# Patient Record
Sex: Female | Born: 1975 | Race: White | Hispanic: No | Marital: Married | Smoking: Never smoker
Health system: Southern US, Community
[De-identification: ages and names within clinical notes are randomized; demographics above are authoritative.]

## PROBLEM LIST (undated history)

## (undated) DIAGNOSIS — I10 Essential (primary) hypertension: Secondary | ICD-10-CM

## (undated) DIAGNOSIS — F419 Anxiety disorder, unspecified: Secondary | ICD-10-CM

## (undated) DIAGNOSIS — R112 Nausea with vomiting, unspecified: Secondary | ICD-10-CM

## (undated) DIAGNOSIS — F32A Depression, unspecified: Secondary | ICD-10-CM

## (undated) DIAGNOSIS — F329 Major depressive disorder, single episode, unspecified: Secondary | ICD-10-CM

## (undated) DIAGNOSIS — I639 Cerebral infarction, unspecified: Secondary | ICD-10-CM

## (undated) DIAGNOSIS — J45909 Unspecified asthma, uncomplicated: Secondary | ICD-10-CM

## (undated) DIAGNOSIS — Z9889 Other specified postprocedural states: Secondary | ICD-10-CM

## (undated) HISTORY — DX: Unspecified asthma, uncomplicated: J45.909

## (undated) HISTORY — DX: Depression, unspecified: F32.A

## (undated) HISTORY — DX: Anxiety disorder, unspecified: F41.9

## (undated) HISTORY — PX: MASTECTOMY: SHX3

## (undated) HISTORY — PX: BREAST BIOPSY: SHX20

## (undated) HISTORY — DX: Major depressive disorder, single episode, unspecified: F32.9

## (undated) HISTORY — DX: Cerebral infarction, unspecified: I63.9

---

## 1998-06-06 ENCOUNTER — Emergency Department (HOSPITAL_COMMUNITY): Admission: EM | Admit: 1998-06-06 | Discharge: 1998-06-06 | Payer: Self-pay | Admitting: Emergency Medicine

## 1999-07-07 ENCOUNTER — Inpatient Hospital Stay (HOSPITAL_COMMUNITY): Admission: EM | Admit: 1999-07-07 | Discharge: 1999-07-11 | Payer: Self-pay | Admitting: Emergency Medicine

## 1999-07-07 ENCOUNTER — Encounter: Payer: Self-pay | Admitting: Emergency Medicine

## 1999-07-08 ENCOUNTER — Encounter: Payer: Self-pay | Admitting: Neurology

## 2000-10-24 HISTORY — PX: TUBAL LIGATION: SHX77

## 2001-06-12 ENCOUNTER — Other Ambulatory Visit: Admission: RE | Admit: 2001-06-12 | Discharge: 2001-06-12 | Payer: Self-pay | Admitting: Obstetrics and Gynecology

## 2001-07-05 ENCOUNTER — Ambulatory Visit (HOSPITAL_COMMUNITY): Admission: RE | Admit: 2001-07-05 | Discharge: 2001-07-05 | Payer: Self-pay | Admitting: Obstetrics and Gynecology

## 2001-09-05 ENCOUNTER — Encounter: Payer: Self-pay | Admitting: Family Medicine

## 2001-09-05 ENCOUNTER — Encounter: Admission: RE | Admit: 2001-09-05 | Discharge: 2001-09-05 | Payer: Self-pay | Admitting: Family Medicine

## 2001-10-24 DIAGNOSIS — I639 Cerebral infarction, unspecified: Secondary | ICD-10-CM

## 2001-10-24 HISTORY — DX: Cerebral infarction, unspecified: I63.9

## 2002-10-09 ENCOUNTER — Other Ambulatory Visit: Admission: RE | Admit: 2002-10-09 | Discharge: 2002-10-09 | Payer: Self-pay | Admitting: Obstetrics and Gynecology

## 2003-05-01 ENCOUNTER — Other Ambulatory Visit: Admission: RE | Admit: 2003-05-01 | Discharge: 2003-05-01 | Payer: Self-pay | Admitting: Obstetrics and Gynecology

## 2003-07-10 ENCOUNTER — Encounter: Payer: Self-pay | Admitting: Family Medicine

## 2003-07-10 ENCOUNTER — Encounter: Admission: RE | Admit: 2003-07-10 | Discharge: 2003-07-10 | Payer: Self-pay | Admitting: Family Medicine

## 2008-06-19 ENCOUNTER — Ambulatory Visit: Payer: Self-pay | Admitting: Family Medicine

## 2008-06-19 DIAGNOSIS — J309 Allergic rhinitis, unspecified: Secondary | ICD-10-CM | POA: Insufficient documentation

## 2008-06-19 DIAGNOSIS — R519 Headache, unspecified: Secondary | ICD-10-CM | POA: Insufficient documentation

## 2008-06-19 DIAGNOSIS — R51 Headache: Secondary | ICD-10-CM | POA: Insufficient documentation

## 2008-06-19 DIAGNOSIS — Z87442 Personal history of urinary calculi: Secondary | ICD-10-CM | POA: Insufficient documentation

## 2008-06-19 DIAGNOSIS — J45990 Exercise induced bronchospasm: Secondary | ICD-10-CM | POA: Insufficient documentation

## 2008-07-02 ENCOUNTER — Ambulatory Visit: Payer: Self-pay | Admitting: Family Medicine

## 2008-07-04 ENCOUNTER — Ambulatory Visit: Payer: Self-pay | Admitting: Internal Medicine

## 2008-07-08 ENCOUNTER — Encounter (INDEPENDENT_AMBULATORY_CARE_PROVIDER_SITE_OTHER): Payer: Self-pay | Admitting: Internal Medicine

## 2008-07-08 DIAGNOSIS — K8020 Calculus of gallbladder without cholecystitis without obstruction: Secondary | ICD-10-CM | POA: Insufficient documentation

## 2008-07-22 ENCOUNTER — Encounter (INDEPENDENT_AMBULATORY_CARE_PROVIDER_SITE_OTHER): Payer: Self-pay | Admitting: Internal Medicine

## 2008-08-04 ENCOUNTER — Ambulatory Visit: Payer: Self-pay | Admitting: Family Medicine

## 2008-08-06 ENCOUNTER — Encounter: Payer: Self-pay | Admitting: Family Medicine

## 2008-08-06 ENCOUNTER — Ambulatory Visit: Payer: Self-pay | Admitting: Family Medicine

## 2008-08-06 ENCOUNTER — Other Ambulatory Visit: Admission: RE | Admit: 2008-08-06 | Discharge: 2008-08-06 | Payer: Self-pay | Admitting: Family Medicine

## 2008-08-06 LAB — CONVERTED CEMR LAB
ALT: 36 units/L — ABNORMAL HIGH (ref 0–35)
AST: 22 units/L (ref 0–37)
Albumin: 3.5 g/dL (ref 3.5–5.2)
Alkaline Phosphatase: 45 units/L (ref 39–117)
BUN: 11 mg/dL (ref 6–23)
Bilirubin, Direct: 0.1 mg/dL (ref 0.0–0.3)
CO2: 29 meq/L (ref 19–32)
Calcium: 8.9 mg/dL (ref 8.4–10.5)
Chloride: 106 meq/L (ref 96–112)
Cholesterol: 197 mg/dL (ref 0–200)
Creatinine, Ser: 0.6 mg/dL (ref 0.4–1.2)
GFR calc Af Amer: 147 mL/min
GFR calc non Af Amer: 122 mL/min
Glucose, Bld: 84 mg/dL (ref 70–99)
HDL: 39.7 mg/dL (ref >39.0)
LDL Cholesterol: 141 mg/dL — ABNORMAL HIGH (ref 0–99)
Pap Smear: NORMAL
Potassium: 3.9 meq/L (ref 3.5–5.1)
Sodium: 142 meq/L (ref 135–145)
Total Bilirubin: 0.5 mg/dL (ref 0.3–1.2)
Total CHOL/HDL Ratio: 5
Total Protein: 6.7 g/dL (ref 6.0–8.3)
Triglycerides: 82 mg/dL (ref 0–149)
VLDL: 16 mg/dL (ref 0–40)

## 2008-08-15 ENCOUNTER — Encounter (INDEPENDENT_AMBULATORY_CARE_PROVIDER_SITE_OTHER): Payer: Self-pay | Admitting: *Deleted

## 2008-08-19 ENCOUNTER — Encounter: Payer: Self-pay | Admitting: Family Medicine

## 2010-07-24 HISTORY — PX: GALLBLADDER SURGERY: SHX652

## 2010-11-21 LAB — CONVERTED CEMR LAB
Amylase: 41 units/L (ref 27–131)
BUN: 16 mg/dL (ref 6–23)
Basophils Absolute: 0.1 10*3/uL (ref 0.0–0.1)
Basophils Relative: 1.5 % (ref 0.0–3.0)
CO2: 28 meq/L (ref 19–32)
Calcium: 8.7 mg/dL (ref 8.4–10.5)
Chloride: 108 meq/L (ref 96–112)
Creatinine, Ser: 0.6 mg/dL (ref 0.4–1.2)
Eosinophils Absolute: 0.3 10*3/uL (ref 0.0–0.7)
Eosinophils Relative: 3 % (ref 0.0–5.0)
GFR calc Af Amer: 148 mL/min
GFR calc non Af Amer: 122 mL/min
Glucose, Bld: 83 mg/dL (ref 70–99)
HCT: 40 % (ref 36.0–46.0)
Hemoglobin: 13.6 g/dL (ref 12.0–15.0)
Lipase: 24 units/L (ref 11.0–59.0)
Lymphocytes Relative: 28.1 % (ref 12.0–46.0)
MCHC: 34 g/dL (ref 30.0–36.0)
MCV: 91.1 fL (ref 78.0–100.0)
Monocytes Absolute: 0.7 10*3/uL (ref 0.1–1.0)
Monocytes Relative: 7.4 % (ref 3.0–12.0)
Neutro Abs: 5.7 10*3/uL (ref 1.4–7.7)
Neutrophils Relative %: 60 % (ref 43.0–77.0)
Pap Smear: NORMAL
Platelets: 227 10*3/uL (ref 150–400)
Potassium: 4.1 meq/L (ref 3.5–5.1)
RBC: 4.39 M/uL (ref 3.87–5.11)
RDW: 12.4 % (ref 11.5–14.6)
Sodium: 139 meq/L (ref 135–145)
WBC: 9.5 10*3/uL (ref 4.5–10.5)

## 2010-11-23 NOTE — Consult Note (Signed)
Summary: [facility]/Dr. [facility]/Dr. Freida Busman   Imported By: Eleonore Chiquito 08/26/2008 15:02:28  _____________________________________________________________________  External Attachment:    Type:   Image     Comment:   External Document

## 2010-11-23 NOTE — Assessment & Plan Note (Signed)
Summary: NEW PATIENT/[facility]   Vital Signs:  Patient Profile:   35 Years Old Female Height:     64 inches Weight:      158 pounds Temp:     97.8 degrees F oral Pulse rate:   80 / minute Pulse rhythm:   regular BP sitting:   102 / 70  (left arm) Cuff size:   regular  Vitals Entered ByMarland Kitchen Delilah Shan (June 19, 2008 10:39 AM)                 Chief Complaint:  New Patient to Establish.  History of Present Illness: Never had chol or DM screen.  Occ chest pain, fatigue associated with it. No SOB, but pleuritic pain occ with it feels like heart beating really fast or really slow mild nausea with it no real panic with it Chest pain last few hours to a few days occuring for several years, about once a month no associated with eating, occurs at rest, occ has exertional compnent where she has exercised and pain started after and was worsened  She is very anxious, stressed and wonders if this is associated  History of TIA 2001: no clear cause of clot, not on birth control, but was on ephedrine,    Current Allergies (reviewed today): No known allergies   Past Medical History:    Allergic rhinitis  Past Surgical History:    C-section 1995, BTL   Family History:    father: CVA hemmorhagic, HTN, high cholesterol    mother: HTN, high cholesterol, DM, breast cancer age 58, morbid obesity    siblings: healthy    MGF: DVT, varicosities    MGM: Alzheimer's  Social History:    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      Denies fever, loss of appetite, malaise, sleep disorder, sweats, and weakness.  CV  Complains of chest pain or discomfort and palpitations.      Denies lightheadness, near fainting, shortness of breath with exertion, swelling of feet, and swelling of hands.  Resp      Denies sputum productive.  GI      chroninc  abdominal pain diffuse  GU      Denies abnormal vaginal bleeding and dysuria.  MS      Denies joint pain, joint redness, and joint swelling.  Derm      Denies lesion(s).  Psych      Complains of anxiety.      Denies depression, easily angered, easily tearful, irritability, and suicidal thoughts/plans.   Physical Exam  General:     Well-developed,well-nourished,in no acute distress; alert,appropriate and cooperative throughout examination Head:     Normocephalic and atraumatic without obvious abnormalities. No apparent alopecia or balding. Eyes:     No corneal or conjunctival inflammation noted. EOMI. Perrla. Funduscopic exam benign, without hemorrhages, exudates or papilledema. Vision grossly normal. Ears:     External ear exam shows no significant lesions or deformities.  Otoscopic examination reveals  clear canals, tympanic membranes are intact bilaterally without bulging, retraction, inflammation or discharge. Hearing is grossly normal bilaterally. Nose:     External nasal examination shows no deformity or inflammation. Nasal mucosa are pink and moist without lesions or exudates. Mouth:     Oral mucosa and oropharynx without lesions or exudates.  Teeth in good repair. Neck:     no carotid bruit or thyromegaly  Chest Wall:     No deformities, masses, or tenderness noted. Points to left CC junction where pain has been in past Lungs:     Normal respiratory effort, chest expands symmetrically. Lungs are clear to auscultation, no crackles or wheezes. Heart:     Normal rate and regular rhythm. S1 and S2 normal without gallop, murmur, click, rub or other extra sounds. Abdomen:     Bowel sounds positive,abdomen soft and non-tender without masses,  organomegaly or hernias noted. Pulses:     R and L posterior tibial pulses are full and equal bilaterally  Extremities:     no edema    Impression & Recommendations:  Problem # 1:  CHEST PAIN, ATYPICAL (ICD-786.59) EKG nml. Given description of pain it is most likely intermittant costochondirits. Recommend chest wall stretches, Ibuprofen 800 mg q8 hours as needed pain.Will check DM and chol screen. 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 Will get records to review to determine if further testing needed.   Complete Medication List: 1)  [place] Hoodia 250 Mg Caps (Hoodia gordonii) .... Sometimes   Patient Instructions: 1)  Fasting LIPIDs, CMET Dx v77.91 2)  CPE with pap few days after labs   ] Current Allergies (reviewed today): No known allergies  Current Medications (including changes made in today's visit):  S[place] HOODIA 250 MG CAPS (HOODIA GORDONII) sometimes   Tetanus/Td Immunization History:    Tetanus/Td # 1:  Historical (11/24/2005)

## 2010-11-23 NOTE — Letter (Signed)
Summary: Results Follow up Letter  [place] [facility]  [address] [place], [place] [address]   Phone: [phone]  Fax: [phone]    08/15/2008 MRN: [id number]    [address] [place] [address] [place], [place]  [address]    Dear Wendy Simpson,  The following are the results of your recent test(s):  Test         Result    Pap Smear:        Normal __X___  Not Normal _____ Comments:  Routine yearly follow up is recommended.  You will be due again: 07/2009 ______________________________________________________ Cholesterol: LDL(Bad cholesterol):         Your goal is less than:         HDL (Good cholesterol):       Your goal is more than: Comments:  ______________________________________________________ Mammogram:        Normal _____  Not Normal _____ Comments:  ___________________________________________________________________ Hemoccult:        Normal _____  Not normal _______ Comments:    _____________________________________________________________________ Other Tests:    We routinely do not discuss normal results over the telephone.  If you desire a copy of the results, or you have any questions about this information we can discuss them at your next office visit.   Sincerely,    Excell Seltzer, M.D.  AEB:lsf

## 2010-11-23 NOTE — Consult Note (Signed)
Summary: [facility]/Consultation Report/Dr. [facility] Surgery/Consultation Report/Dr. Freida Busman   Imported By: Mickle Asper 07/31/2008 16:46:06  _____________________________________________________________________  External Attachment:    Type:   Image     Comment:   External Document

## 2010-11-23 NOTE — Miscellaneous (Signed)
Summary: Orders Update  Clinical Lists Changes  Problems: Added new problem of CHOLELITHIASIS (ICD-574.20) - Signed Orders: Added new Referral order of Surgical Referral (Surgery) - Signed

## 2010-11-23 NOTE — Assessment & Plan Note (Signed)
Summary: cpx with pap/rbh   Vital Signs:  Patient Profile:   35 Years Old Female Height:     64 inches Weight:      161.13 pounds BMI:     27.76 Temp:     98 degrees F oral Pulse rate:   84 / minute Pulse rhythm:   regular BP sitting:   132 / 92  (left arm) Cuff size:   regular  Vitals Entered By: Delilah Shan (August 06, 2008 2:35 PM)                 Chief Complaint:  CPX with Pap(?)  still has menstrual period.  History of Present Illness: Ding well. No acute concerns, recent gallbladder surgery, healing well.   Acute Visit History: Pertinent previous surgeries include cholecystectomy.           Current Allergies (reviewed today): ! 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       Married       2 children       Never Smoked       Alcohol use-yes, rare       Drug use-no       Regular exercise-no       Diet: fruits and veggies, water    Review of Systems  General      Complains of fatigue.      Denies chills and fever.  CV      Denies chest pain or discomfort.  Resp      Denies shortness of breath.  GI      Denies abdominal pain, bloody stools, constipation, and diarrhea.  GU      Denies abnormal vaginal bleeding and dysuria.      no vaginal discharge, no concern about STDs  Derm      Denies rash.      no breast lesions   Physical Exam  General:     Well-developed,well-nourished,in no acute distress; alert,appropriate and cooperative throughout examination Eyes:     No corneal or conjunctival inflammation noted. EOMI. Perrla. Funduscopic exam benign, without hemorrhages, exudates or papilledema. Vision grossly normal. Ears:  External ear exam shows no significant lesions or deformities.  Otoscopic examination reveals clear canals, tympanic membranes are intact bilaterally without bulging, retraction, inflammation or discharge. Hearing is grossly normal bilaterally. Nose:     External nasal examination shows no deformity or inflammation. Nasal mucosa are pink and moist without lesions or exudates. Mouth:     Oral mucosa and oropharynx without lesions or exudates.  Teeth in good repair. Neck:     no carotid bruit or thyromegaly  Breasts:     No mass, nodules, thickening, tenderness, bulging, retraction, inflamation, nipple discharge or skin changes noted.   Lungs:     Normal respiratory effort, chest expands symmetrically. Lungs are clear to auscultation, no crackles or wheezes. Heart:     Normal rate and regular rhythm. S1 and S2 normal without gallop, murmur, click, rub or other extra sounds. Abdomen:     Bowel sounds positive,abdomen soft and non-tender without masses, organomegaly or hernias noted. Well healing surgical sites Genitalia:  Pelvic Exam:        External: normal female genitalia without lesions or masses        Vagina: normal without lesions or masses        Cervix: normal without lesions or masses        Adnexa: normal bimanual exam without masses or fullness        Uterus: normal by palpation        Pap smear: performed Msk:     No deformity or scoliosis noted of thoracic or lumbar spine.   Pulses:     R and L posterior tibial pulses are full and equal bilaterally  Neurologic:     No cranial nerve deficits noted. Station and gait are normal. DTRs are symmetrical throughout. Sensory, motor and coordinative functions appear intact. Skin:     Intact without suspicious lesions or rashes Cervical Nodes:     no cervical or supraclavicular lymphadenopathy  Psych:     Cognition and judgment appear intact. Alert and cooperative with normal attention span and concentration. No apparent  delusions, illusions, hallucinations    Impression & Recommendations:  Problem # 1:  Preventive Health Care (ICD-V70.0) Reviewed preventive care protocols, scheduled due services, and updated immunizations. Encouraged exercise, weight loss, healthy eating habits.   Problem # 2:  Gynecological examination-routine (ICD-V72.31) PAP pending.     ] Prior Medications (reviewed today): None Current Allergies (reviewed today): ! CODEINE Current Medications (including changes made in today's visit):  None    Flu Vaccine Result Date:  08/06/2008 Flu Vaccine Result:  refused Flu Vaccine Next Due:  Refused

## 2010-11-23 NOTE — Assessment & Plan Note (Signed)
Summary: STOMACH,BACK/CLE   Vital Signs:  Patient Profile:   35 Years Old Female Height:     64 inches Weight:      160 pounds BMI:     27.56 Temp:     98.6 degrees F oral Pulse rate:   81 / minute BP sitting:   147 / 100  (right arm) Cuff size:   regular  Vitals Entered By: Cooper Render (July 02, 2008 4:15 PM)                 Chief Complaint:  Abdominal Pain under R) breast around to back.  History of Present Illness: Here for RUQ pain and to the back up as high as between scapula. --Has never had GB looked at.  Has hx of renal stones. Only family hx isd Maunt who had to have chloecystectomy -- Worse when lies down, onset  4 wks.   --Denies indigestion, does not seem to respond to what she eats.  --Pain there all the time, dull to sharpe, hurts to wear tight pants.  --Has had nausea, no vomiting, no diarrhea  --Took OCP yrs ago.  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    Married    2 children    Never Smoked    Alcohol use-yes, rare    Drug use-no    Regular exercise-no    Diet: fruits and veggies, water    Review of Systems  CV      Denies chest pain or discomfort, palpitations, swelling of feet, and swelling of hands.  Resp      Denies shortness of breath and wheezing.  GI      See HPI  GU      Denies discharge.  MS      See HPI  Psych      Denies anxiety and  depression.   Physical Exam  General:     alert, well-developed, well-nourished, well-hydrated, and overweight-appearing.  NAD Lungs:     normal respiratory effort, no intercostal retractions, no accessory muscle use, and normal breath sounds.   Heart:     repeat BP  122/86 Abdomen:     soft, normal bowel sounds, no distention, no masses, no abdominal hernia, no inguinal hernia, no hepatomegaly, and no splenomegaly.   --tender in RUQ to midepigastric > RLQ --rebound RUQ only, no referred rebound Msk:     no pain with palp spine or paraspinous bilat thoracic and upper lumbar area --pain at forward flex in anterior--RUQ --minimal tenderness with lateral flexion R and rotation to L only Neurologic:     alert & oriented X3, sensation intact to light touch, and gait normal.   Inguinal Nodes:     no R inguinal adenopathy and no L inguinal adenopathy.  Psych:     normally interactive.      Impression & Recommendations:  Problem # 1:  RUQ PAIN (ICD-789.01) Assessment: New quite tender in RUQ--need to rule out gall bladder in etiology despite the fact that she see no correlation of the pain to food intake will get CBC, amylase and lipase, bmest to usse low fat, bland diet until U/S will get U/S of upper abd will continue to use excedrin as needed--refused analgesic Orders: Radiology Referral (Radiology)    Patient Instructions: 1)  referral for U/S   ] Prior Medications (reviewed today): Current Allergies (reviewed today): No known allergies

## 2011-03-11 NOTE — Op Note (Signed)
[facility]  Patient:    Wendy Simpson, Wendy Simpson Visit Number: [id number] MRN: [id number]          Service Type: DSU Location: DAY Attending Physician:  Rosalee Kaufman Dictated by:   Harl Bowie, M.D. Proc. Date: 07/05/01 Admit Date:  07/05/2001                             Operative Report  PREOPERATIVE DIAGNOSIS:  Multiparity for sterilization.  POSTOPERATIVE DIAGNOSIS:  Multiparity for sterilization.  OPERATION:  Bilateral tubal cautery and separation by laparoscopy.  SURGEON:  Harl Bowie, M.D.  ANESTHESIA:  General  FINDINGS AND PROCEDURE:  The patient was prepped and draped in the usual fashion for combined abdominal and vaginal procedure.  The patient was examined and found to have uterus normal size in slight posterior position. The adnexa are clear.  Following the bladder was catheterized.  The cervix was then grasped and the cervix sounded and the sound fixed to the cervix. Following a small transverse incision was made below the umbilicus and air insufflation needle was placed in the abdomen.  Then 2 liters of carbon dioxide were instilled.  Following the trocar was placed through this same incision and the laparoscope inserted through its sleeve.  Inspection of the pelvis was normal with the exception of a few adhesions in the area of the left adnexa. The right fallopian tube was isolated, elevated, cauterized and separated.  A similar procedure was carried out on the left fallopian tube. Hemostasis was good at this point in the operation.  The excess carbon dioxide was removed and the instruments removed and the incision closed with a subcuticular Dexon suture.  The blood loss was minimal.  The patient tolerated the procedure well and was sent to the recovery room in good condition. Dictated by:   Harl Bowie, M.D. Attending Physician:  Rosalee Kaufman DD:  07/05/01 TD:  07/05/01 Job: [id number]

## 2011-07-25 HISTORY — PX: KIDNEY STONE SURGERY: SHX686

## 2011-08-09 ENCOUNTER — Emergency Department: Payer: Self-pay | Admitting: Unknown Physician Specialty

## 2011-08-11 ENCOUNTER — Emergency Department (HOSPITAL_COMMUNITY): Payer: BC Managed Care – PPO

## 2011-08-11 ENCOUNTER — Emergency Department (HOSPITAL_COMMUNITY)
Admission: EM | Admit: 2011-08-11 | Discharge: 2011-08-12 | Disposition: A | Discharge status: Home / Self Care | Payer: BC Managed Care – PPO | Attending: Emergency Medicine | Admitting: Emergency Medicine

## 2011-08-11 DIAGNOSIS — N201 Calculus of ureter: Secondary | ICD-10-CM | POA: Insufficient documentation

## 2011-08-11 DIAGNOSIS — R112 Nausea with vomiting, unspecified: Secondary | ICD-10-CM | POA: Insufficient documentation

## 2011-08-11 DIAGNOSIS — R109 Unspecified abdominal pain: Secondary | ICD-10-CM | POA: Insufficient documentation

## 2011-08-11 LAB — URINALYSIS, ROUTINE W REFLEX MICROSCOPIC
Glucose, UA: NEGATIVE mg/dL
Ketones, ur: 40 mg/dL — AB
Nitrite: NEGATIVE
Protein, ur: 30 mg/dL — AB
Specific Gravity, Urine: 1.024 (ref 1.005–1.030)
Urobilinogen, UA: 1 mg/dL (ref 0.0–1.0)
pH: 5.5 (ref 5.0–8.0)

## 2011-08-11 LAB — DIFFERENTIAL
Basophils Absolute: 0 10*3/uL (ref 0.0–0.1)
Basophils Relative: 0 % (ref 0–1)
Eosinophils Absolute: 0.1 10*3/uL (ref 0.0–0.7)
Eosinophils Relative: 0 % (ref 0–5)
Lymphocytes Relative: 16 % (ref 12–46)
Lymphs Abs: 3.1 10*3/uL (ref 0.7–4.0)
Monocytes Absolute: 1.2 10*3/uL — ABNORMAL HIGH (ref 0.1–1.0)
Monocytes Relative: 6 % (ref 3–12)
Neutro Abs: 15.1 10*3/uL — ABNORMAL HIGH (ref 1.7–7.7)
Neutrophils Relative %: 77 % (ref 43–77)

## 2011-08-11 LAB — COMPREHENSIVE METABOLIC PANEL
ALT: 16 U/L (ref 0–35)
AST: 17 U/L (ref 0–37)
Albumin: 3.9 g/dL (ref 3.5–5.2)
Alkaline Phosphatase: 69 U/L (ref 39–117)
BUN: 18 mg/dL (ref 6–23)
CO2: 25 mEq/L (ref 19–32)
Calcium: 9.3 mg/dL (ref 8.4–10.5)
Chloride: 99 mEq/L (ref 96–112)
Creatinine, Ser: 0.78 mg/dL (ref 0.50–1.10)
GFR calc Af Amer: >90 mL/min (ref >90)
GFR calc non Af Amer: >90 mL/min (ref >90)
Glucose, Bld: 112 mg/dL — ABNORMAL HIGH (ref 70–99)
Potassium: 3.2 mEq/L — ABNORMAL LOW (ref 3.5–5.1)
Sodium: 137 mEq/L (ref 135–145)
Total Bilirubin: 0.3 mg/dL (ref 0.3–1.2)
Total Protein: 7.8 g/dL (ref 6.0–8.3)

## 2011-08-11 LAB — CBC
HCT: 40.2 % (ref 36.0–46.0)
Hemoglobin: 13.5 g/dL (ref 12.0–15.0)
MCH: 29.4 pg (ref 26.0–34.0)
MCHC: 33.6 g/dL (ref 30.0–36.0)
MCV: 87.6 fL (ref 78.0–100.0)
Platelets: 307 10*3/uL (ref 150–400)
RBC: 4.59 MIL/uL (ref 3.87–5.11)
RDW: 13.8 % (ref 11.5–15.5)
WBC: 19.5 10*3/uL — ABNORMAL HIGH (ref 4.0–10.5)

## 2011-08-11 LAB — URINE MICROSCOPIC-ADD ON

## 2011-08-11 LAB — PREGNANCY, URINE: Preg Test, Ur: NEGATIVE

## 2011-08-12 LAB — URINE CULTURE
Colony Count: 15000
Culture  Setup Time: 201210180024

## 2011-08-24 ENCOUNTER — Ambulatory Visit (HOSPITAL_BASED_OUTPATIENT_CLINIC_OR_DEPARTMENT_OTHER)
Admission: RE | Admit: 2011-08-24 | Discharge: 2011-08-24 | Disposition: A | Discharge status: Home / Self Care | Payer: BC Managed Care – PPO | Source: Ambulatory Visit | Attending: Urology | Admitting: Urology

## 2011-08-24 DIAGNOSIS — R1032 Left lower quadrant pain: Secondary | ICD-10-CM | POA: Insufficient documentation

## 2011-08-24 DIAGNOSIS — I679 Cerebrovascular disease, unspecified: Secondary | ICD-10-CM | POA: Insufficient documentation

## 2011-08-24 DIAGNOSIS — Z79899 Other long term (current) drug therapy: Secondary | ICD-10-CM | POA: Insufficient documentation

## 2011-08-24 DIAGNOSIS — N201 Calculus of ureter: Secondary | ICD-10-CM | POA: Insufficient documentation

## 2011-08-24 LAB — POCT HEMOGLOBIN-HEMACUE: Hemoglobin: 13.2 g/dL (ref 12.0–15.0)

## 2011-08-24 NOTE — Op Note (Signed)
NAMEANGELIC, SCHNELLE NO.:  [id number]  MEDICAL RECORD NO.:  [id number]  LOCATION:                                 FACILITY:  PHYSICIAN:  Valetta Fuller, MD    DATE OF BIRTH:  Dec 19, 1975  DATE OF PROCEDURE:  08/24/2011 DATE OF DISCHARGE:                              OPERATIVE REPORT   PREOPERATIVE DIAGNOSIS:  11-mm midleft ureteral calculus.  POSTOPERATIVE DIAGNOSIS:  11-mm midleft ureteral calculus.  PROCEDURE PERFORMED:  Cystoscopy, left retrograde pyelogram with fluoroscopic interpretation, ureteroscopy, holmium laser lithotripsy, basketing the stone fragments, and double-J stent placement.  SURGEON:  Valetta Fuller, MD  ANESTHESIA:  General.  INDICATIONS:  Wendy Simpson is 35 years of age.  She has had nephrolithiasis in the distant past.  She had presented to the emergency room with typical left renal colic and was diagnosed with 11-mm stone at the junction of her mid and distal left ureter.  The patient had no immediate need for urgent intervention.  The patient had no sign of urosepsis or pyelonephritis.  She has been on a one-a-day preventative antibiotic with ciprofloxacin.  We felt that this stone had a very low likelihood of spontaneous passage given its size and recommended that we consider intervention.  We discussed the pros and cons of various interventions with her and decided upon ureteroscopy with holmium laser lithotripsy. We discussed the risks and benefits and potential chances of success. Full informed consent has been obtained.  The patient has received perioperative Ancef.  The patient also had placement of PAS compression boots.  TECHNIQUE AND FINDINGS:  The patient was brought to the operating room where she had successful induction of general anesthesia.  She was placed in lithotomy position and prepped and draped in usual manner. Appropriate surgical time-out was performed.  Cystoscopy revealed unremarkable urethra and  bladder.  Retrograde pyelogram was done with an open-ended catheter under fluoroscopic interpretation.  The patient had an 8-mm filling defect really in her mid ureter on the left causing mild obstruction.  The left- sided collecting system more proximally did not appear to be substantially dilated.  A guidewire was placed beyond the stone.  We initially engaged her distal ureter with a 6.5-French ureteroscope, but the area was somewhat tight in her intramural ureter.  Therefore, we removed the ureteroscope and did one step distal ureteral dilation with the inside portion of an access sheath.  Reinsertion of the ureteroscope was then accomplished without difficulty and we were able to drive the ureteroscope up to the stone.  This was found to be free floating in the ureter without evidence of any significant inflammation or edema.  Holmium-laser lithotriptor fiber was then used.  The stone was found to be quite hard and it was chipped away slowly into very fine small pieces.  At the completion, there was a remaining 4-5 mm fragment, which was basket extracted without difficulty.  Repeat ureteroscopy did not reveal any obvious significant residual fragments.  No evidence of ureteral injury or other issues.  A 24-cm 6-French double-J stent was then placed with fluoroscopic as well as visual guidance.  A dangle string was left, which was secured to the  patient's inner thigh.  She was taken to recovery room in stable condition having had no obvious complications or problems.     Valetta Fuller, MD     DSG/MEDQ  D:  08/24/2011  T:  08/24/2011  Job:  [id number]  Electronically Signed by Barron Alvine M.D. on 08/24/2011 10:10:45 PM

## 2012-03-29 ENCOUNTER — Ambulatory Visit (INDEPENDENT_AMBULATORY_CARE_PROVIDER_SITE_OTHER): Payer: BC Managed Care – PPO | Admitting: Family Medicine

## 2012-03-29 VITALS — BP 166/89 | HR 108 | Temp 98.2°F | Resp 18 | Ht 64.0 in | Wt 184.0 lb

## 2012-03-29 DIAGNOSIS — F32A Depression, unspecified: Secondary | ICD-10-CM

## 2012-03-29 DIAGNOSIS — G4700 Insomnia, unspecified: Secondary | ICD-10-CM

## 2012-03-29 DIAGNOSIS — F329 Major depressive disorder, single episode, unspecified: Secondary | ICD-10-CM

## 2012-03-29 LAB — TSH: TSH: 1.622 u[IU]/mL (ref 0.350–4.500)

## 2012-03-29 MED ORDER — FLUOXETINE HCL 20 MG PO TABS: 20.0000 mg | ORAL_TABLET | Freq: Every day | ORAL | Status: DC

## 2012-03-29 NOTE — Patient Instructions (Addendum)
To look up more info on your condition, go to the website [email or web address], then on patient resources - select [facility]. Under patient resources, select Insomnia, depression, anxiety.  Counselors:  Vernell Leep: [phone] Arbutus Ped: [phone] Virl Cagey: [phone]  Keep a record of your blood pressures outside of the office and bring them to the next office visit. Return to the clinic or go to the nearest emergency room if any of your symptoms worsen or new symptoms occur.

## 2012-03-29 NOTE — Progress Notes (Signed)
  Subjective:    Patient ID: Wendy Simpson, female    DOB: Mar 09, 1976, 36 y.o.   MRN: [id number]  HPI  Wendy Simpson is a 36 y.o. female Past 6 months, worse past 2 months.   trouble eating, trouble sleeping, wants to sleep. Not wanting to do anything.  Unmotivated.at home.  Doing things out of necessity.  No regular current activities for enjoyment, no exercise.  Frustrated that not doing anything. Insomnia - trouble getting to sleep - mind racing, tired, but no manic symptoms. Occasional sleeping pill otc during week.    Stressors :   Kids - 21yo dtr going through custody battle - gets frustrated, and "blows up" at times.  Having to bail her out of trouble at times as well.  Feels like has to fix the problem, trouble letting go in general, and tries to fix everything.   17yo son - just got license. "blows up at him"  At times as well.  Meltdown yesterday - argument with husband, resolved work issue.   Feels broken now, not able to fix things.  No suicide thoughts.  >10 years ago on Prozac for 1 week only.  FH:  Mother - depression.  NO known FH of Bipolar.  Uncle with mental illness, drug use in past.  SH: works from home. Part time job at [employer].   Takes care of 2 yo grandson at home.  No IDU, rare alcohol, no tobacco.  2 caffeinated drinks per day.   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  Talks fast, but no other residual medicines.   Thinks was due to large doses of ephedrine for weight loss.  No known hx of HTN, but dad has HTN.    Has new appt with Dr. Audria Nine. In few weeks.   Review of Systems  Neurological: Negative for weakness.  Psychiatric/Behavioral: Positive for sleep disturbance. Negative for suicidal ideas. The patient is nervous/anxious.        Objective:   Physical Exam  Constitutional: She appears well-developed and well-nourished.  HENT:  Head: Normocephalic and atraumatic.  Eyes: Pupils are equal, round, and reactive to light.  Neck:  Normal range of motion. No tracheal deviation present. No thyromegaly present.  Lymphadenopathy:    She has no cervical adenopathy.  Skin: Skin is warm and dry. No rash noted.  Psychiatric: She has a normal mood and affect. Her behavior is normal. She expresses no suicidal ideation. She expresses no suicidal plans.       Good eye contact. No distress.        Assessment & Plan:  Wendy Simpson is a 36 y.o. female 1. Depression  TSH  2. Insomnia  TSH    Suspect depression with anhedonia, adjustment with family conflict.  Discussed exercise, sleep hygiene reviewed, coping techniques, relaxation techniques.  Counseling >50% of visit.  Phone numbers for counseling given.  Start prozac 20mg  QD,  # 30, 1 rf.  Check TSH Increased BP without hx htn.  - check outside BP's.   Recheck next 2-4 weeks, sooner if worse.

## 2012-04-12 ENCOUNTER — Encounter: Payer: Self-pay | Admitting: Family Medicine

## 2012-04-12 ENCOUNTER — Ambulatory Visit (INDEPENDENT_AMBULATORY_CARE_PROVIDER_SITE_OTHER): Payer: BC Managed Care – PPO | Admitting: Family Medicine

## 2012-04-12 VITALS — BP 129/82 | HR 77 | Temp 97.4°F | Resp 16 | Ht 62.75 in | Wt 183.2 lb

## 2012-04-12 DIAGNOSIS — F39 Unspecified mood [affective] disorder: Secondary | ICD-10-CM

## 2012-04-12 DIAGNOSIS — Z0000 Encounter for general adult medical examination without abnormal findings: Secondary | ICD-10-CM

## 2012-04-12 DIAGNOSIS — D72829 Elevated white blood cell count, unspecified: Secondary | ICD-10-CM

## 2012-04-12 DIAGNOSIS — F411 Generalized anxiety disorder: Secondary | ICD-10-CM

## 2012-04-12 LAB — BASIC METABOLIC PANEL
BUN: 13 mg/dL (ref 6–23)
CO2: 25 mEq/L (ref 19–32)
Calcium: 9.4 mg/dL (ref 8.4–10.5)
Chloride: 102 mEq/L (ref 96–112)
Creat: 0.61 mg/dL (ref 0.50–1.10)
Glucose, Bld: 81 mg/dL (ref 70–99)
Potassium: 4 mEq/L (ref 3.5–5.3)
Sodium: 139 mEq/L (ref 135–145)

## 2012-04-12 LAB — POCT URINALYSIS DIPSTICK
Glucose, UA: NEGATIVE
Leukocytes, UA: NEGATIVE
Nitrite, UA: NEGATIVE
Protein, UA: 30
Spec Grav, UA: >=1.03
Urobilinogen, UA: 1
pH, UA: 5.5

## 2012-04-12 LAB — T4, FREE: Free T4: 1.26 ng/dL (ref 0.80–1.80)

## 2012-04-12 MED ORDER — DIVALPROEX SODIUM ER 250 MG PO TB24: 250.0000 mg | ORAL_TABLET | Freq: Every day | ORAL | Status: DC

## 2012-04-12 NOTE — Patient Instructions (Addendum)
Mood Disorders Mood disorders are conditions that affect the way a person feels emotionally. The main mood disorders include:  Depression.   Bipolar disorder.   Dysthymia. Dysthymia is a mild, lasting (chronic) depression. Symptoms of dysthymia are similar to depression, but not as severe.   Cyclothymia. Cyclothymia includes mood swings, but the highs and lows are not as severe as they are in bipolar disorder. Symptoms of cyclothymia are similar to those of bipolar disorder, but less extreme.  CAUSES  Mood disorders are probably caused by a combination of factors. People with mood disorders seem to have physical and chemical changes in their brains. Mood disorders run in families, so there may be genetic causes. Severe trauma or stressful life events may also increase the risk of mood disorders.  SYMPTOMS  Symptoms of mood disorders depend on the specific type of condition. Depression symptoms include:  Feeling sad, worthless, or hopeless.   Negative thoughts.   Inability to enjoy one's usual activities.   Low energy.   Sleeping too much or too little.   Appetite changes.   Crying.   Concentration problems.   Thoughts of harming oneself.  Bipolar disorder symptoms include:  Periods of depression (see above symptoms).   Mood swings, from sadness and depression, to abnormal elation and excitement.   Periods of mania:   Racing thoughts.   Fast speech.   Poor judgment, and careless, dangerous choices.   Decreased need for sleep.   Risky behavior.   Difficulty concentrating.   Irritability.   Increased energy.   Increased sex drive.  DIAGNOSIS  There are no blood tests or X-rays that can confirm a mood disorder. However, your caregiver may choose to run some tests to make sure that there is not another physical cause for your symptoms. A mood disorder is usually diagnosed after an in-depth interview with a caregiver. TREATMENT  Mood disorders can be treated  with one or more of the following:  Medicine. This may include antidepressants, mood-stabilizers, or anti-psychotics.   Psychotherapy (talk therapy).   Cognitive behavioral therapy. You are taught to recognize negative thoughts and behavior patterns, and replace them with healthy thoughts and behaviors.   Electroconvulsive therapy. For very severe cases of deep depression, a series of treatments in which an electrical current is applied to the brain.   Vagus nerve stimulation. A pulse of electricity is applied to a portion of the brain.   Transcranial magnetic stimulation. Powerful magnets are placed on the head that produce electrical currents.   Hospitalization. In severe situations, or when someone is having serious thoughts of harming him or herself, hospitalization may be necessary in order to keep the person safe. This is also done to quickly start and monitor treatment.  HOME CARE INSTRUCTIONS   Take your medicine exactly as directed.   Attend all of your therapy sessions.   Try to eat regular, healthy meals.   Exercise daily. Exercise may improve mood symptoms.   Get good sleep.   Do not drink alcohol or use pot or other drugs. These can worsen mood symptoms and cause anxiety and psychosis.   Tell your caregiver if you develop any side effects, such as feeling sick to your stomach (nauseous), dry mouth, dizziness, constipation, drowsiness, tremor, weight gain, or sexual symptoms. He or she may suggest things you can do to improve symptoms.   Learn ways to cope with the stress of having a chronic illness. This includes yoga, meditation, tai chi, or participating in a support  group.   Drink enough water to keep your urine clear or pale yellow. Eat a high-fiber diet. These habits may help you avoid constipation from your medicine.  SEEK IMMEDIATE MEDICAL CARE IF:  Your mood worsens.   You have thoughts of hurting yourself or others.   You cannot care for yourself.   You  develop the sensation of hearing or seeing something that is not actually present (auditory or visual hallucinations).   You develop abnormal thoughts.  Document Released: 08/07/2009 Document Revised: 09/29/2011 Document Reviewed: 08/07/2009 [facility] Patient Information 2012 [place], [place]     .Keeping You Healthy  Get These Tests 1. Blood Pressure- Have your blood pressure checked once a year by your health care provider.  Normal blood pressure is 120/80. 2. Weight- Have your body mass index (BMI) calculated to screen for obesity.  BMI is measure of body fat based on height and weight.  You can also calculate your own BMI at https://www.west-esparza.com/. 3. Cholesterol- Have your cholesterol checked every 5 years starting at age 80 then yearly starting at age 69. 4. Chlamydia, HIV, and other sexually transmitted diseases- Get screened every year until age 58, then within three months of each new sexual provider. 5. Pap Smear- Every 1-3 years; discuss with your health care provider. 6. Mammogram- Every year starting at age 25  Take these medicines  Calcium with Vitamin D-Your body needs 1200 mg of Calcium each day and [phone] IU of Vitamin D daily.  Your body can only absorb 500 mg of Calcium at a time so Calcium must be taken in 2 or 3 divided doses throughout the day.  Multivitamin with folic acid- Once daily if it is possible for you to become pregnant.  Get these Immunizations  Gardasil-Series of three doses; prevents HPV related illness such as genital warts and cervical cancer.  Menactra-Single dose; prevents meningitis.  Tetanus shot- Every 10 years.  Flu shot-Every year.  Take these steps 1. Do not smoke-Your healthcare provider can help you quit.  For tips on how to quit go to www.smokefree.gov or call 1-800 QUITNOW. 2. Be physically active- Exercise 5 days a week for at least 30 minutes.  If you are not already physically active, start slow and gradually work up to 30  minutes of moderate physical activity.  Examples of moderate activity include walking briskly, dancing, swimming, bicycling, etc. 3. Breast Cancer- A self breast exam every month is important for early detection of breast cancer.  For more information and instruction on self breast exams, ask your healthcare provider or [email or web address]. 4. Eat a healthy diet- Eat a variety of healthy foods such as fruits, vegetables, whole grains, low fat milk, low fat cheeses, yogurt, lean meats, poultry and fish, beans, nuts, tofu, etc.  For more information go to www. Thenutritionsource.org 5. Drink alcohol in moderation- Limit alcohol intake to one drink or less per day. Never drink and drive. 6. Depression- Your emotional health is as important as your physical health.  If you're feeling down or losing interest in things you normally enjoy please talk to your healthcare provider about being screened for depression. 7. Dental visit- Brush and floss your teeth twice daily; visit your dentist twice a year. 8. Eye doctor- Get an eye exam at least every 2 years. 9. Helmet use- Always wear a helmet when riding a bicycle, motorcycle, rollerblading or skateboarding. 10. Safe sex- If you may be exposed to sexually transmitted infections, use a condom. 11. Seat belts- Seat belts can  save your live; always wear one. 12. Smoke/Carbon Monoxide detectors- These detectors need to be installed on the appropriate level of your home. Replace batteries at least once a year. 13. Skin cancer- When out in the sun please cover up and use sunscreen 15 SPF or higher. 14. Violence- If anyone is threatening or hurting you, please tell your healthcare provider.

## 2012-04-13 LAB — CBC WITH DIFFERENTIAL/PLATELET
Basophils Absolute: 0 10*3/uL (ref 0.0–0.1)
Basophils Relative: 0 % (ref 0–1)
Eosinophils Absolute: 0.1 10*3/uL (ref 0.0–0.7)
Eosinophils Relative: 1 % (ref 0–5)
HCT: 40.6 % (ref 36.0–46.0)
Hemoglobin: 13.4 g/dL (ref 12.0–15.0)
Lymphocytes Relative: 29 % (ref 12–46)
Lymphs Abs: 2.9 10*3/uL (ref 0.7–4.0)
MCH: 28.8 pg (ref 26.0–34.0)
MCHC: 33 g/dL (ref 30.0–36.0)
MCV: 87.3 fL (ref 78.0–100.0)
Monocytes Absolute: 0.5 10*3/uL (ref 0.1–1.0)
Monocytes Relative: 5 % (ref 3–12)
Neutro Abs: 6.7 10*3/uL (ref 1.7–7.7)
Neutrophils Relative %: 65 % (ref 43–77)
Platelets: 313 10*3/uL (ref 150–400)
RBC: 4.65 MIL/uL (ref 3.87–5.11)
RDW: 14.1 % (ref 11.5–15.5)
WBC: 10.2 10*3/uL (ref 4.0–10.5)

## 2012-04-13 LAB — VITAMIN D 25 HYDROXY (VIT D DEFICIENCY, FRACTURES): Vit D, 25-Hydroxy: 43 ng/mL (ref 30–89)

## 2012-04-14 NOTE — Progress Notes (Signed)
Quick Note:  Please notify pt that results are normal.   Provide pt with copy of labs. ______ 

## 2012-04-15 DIAGNOSIS — Z8673 Personal history of transient ischemic attack (TIA), and cerebral infarction without residual deficits: Secondary | ICD-10-CM | POA: Insufficient documentation

## 2012-04-15 NOTE — Progress Notes (Signed)
Subjective:    Patient ID: Wendy Simpson, female    DOB: [birth year], 36 y.o.   MRN: [id number]  HPI   This 36 yo Cauc female was recently seen at [facility] and treated for Depression and Anxiety  with Fluoxetine; symptoms improved initially but now she feels that she is feeling very anxious with  racing thoughts and insomnia. (See dr. Paralee Cancel note from 03/29/12). Multiple stressors at home but hopes  this will improve because grandchildren will be in daycare while she continues to work from home.           Here for CPE today; PAP done in March 2013 at GYN office (normal). Had baseline MMG in 2011 (normal). PMHx: Multiple episodes of kidney stones (pt does not know composition of stones)    Review of Systems  Constitutional: Positive for fatigue. Negative for fever, activity change, appetite change and unexpected weight change.  Eyes: Negative.   Respiratory: Negative.   Cardiovascular: Positive for chest pain. Negative for palpitations and leg swelling.  Gastrointestinal: Negative.   Genitourinary: Negative.   Musculoskeletal: Negative.   Skin: Negative.   Neurological: Positive for dizziness, light-headedness and headaches. Negative for tremors, syncope, speech difficulty, weakness and numbness.  Hematological: Negative.   Psychiatric/Behavioral: Positive for disturbed wake/sleep cycle and decreased concentration. Negative for suicidal ideas, confusion, self-injury, dysphoric mood and agitation. The patient is nervous/anxious.        "My brain is racing"       Objective:   Physical Exam  Nursing note and vitals reviewed. Constitutional: She is oriented to person, place, and time. She appears well-developed and well-nourished. No distress.  HENT:  Head: Normocephalic and atraumatic.  Right Ear: External ear normal.  Left Ear: External ear normal.  Nose: Nose normal.  Mouth/Throat: Oropharynx is clear and moist.  Eyes: Conjunctivae are normal. Pupils are equal, round, and  reactive to light. No scleral icterus.  Neck: Normal range of motion. Neck supple. No thyromegaly present.  Cardiovascular: Normal rate, regular rhythm and normal heart sounds.  Exam reveals no gallop and no friction rub.   No murmur heard. Pulmonary/Chest: Effort normal and breath sounds normal. No respiratory distress. She has no wheezes.  Abdominal: Soft. Bowel sounds are normal. She exhibits no mass. There is no tenderness. There is no guarding.       No organomegaly  Musculoskeletal: Normal range of motion. She exhibits no edema and no tenderness.  Lymphadenopathy:    She has no cervical adenopathy.  Neurological: She is alert and oriented to person, place, and time. She has normal reflexes. No cranial nerve deficit. She exhibits normal muscle tone. Coordination normal.  Skin: Skin is warm and dry.  Psychiatric: Judgment and thought content normal. Her mood appears anxious. Her affect is not angry, not blunt and not labile. Her speech is rapid and/or pressured and tangential. She is agitated. She is not aggressive, is not hyperactive and not actively hallucinating. Cognition and memory are normal. She does not exhibit a depressed mood. She is communicative. She is attentive.    Mood Disorder Questionnaire completed by pt: 7 "yes" answers ("hyper", "irritable/ argumentative"," less sleep requirement", "more talkative", "racing thoughts", "more energy than usual" "spending money that got you or family in trouble"  6 "no" answers                                                                                     "Yes" -several events have happened during the same period of time and "Moderate problem" in current life situation (Form scanned into EMR)     Assessment & Plan:   1. Routine general medical examination at a health care facility  Basic metabolic panel, Vitamin D, 25-hydroxy, POCT urinalysis dipstick    2. Leukocytosis - WBC= 19.5 in Oct 2012 CBC with Differential  3. Anxiety state, unspecified  T4, Free  4. 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

## 2012-05-10 ENCOUNTER — Ambulatory Visit: Payer: BC Managed Care – PPO | Admitting: Family Medicine

## 2016-05-25 ENCOUNTER — Encounter: Payer: Self-pay | Admitting: Emergency Medicine

## 2016-05-25 ENCOUNTER — Emergency Department
Admission: EM | Admit: 2016-05-25 | Discharge: 2016-05-25 | Disposition: A | Discharge status: Home / Self Care | Payer: BLUE CROSS/BLUE SHIELD | Attending: Emergency Medicine | Admitting: Emergency Medicine

## 2016-05-25 DIAGNOSIS — Z5321 Procedure and treatment not carried out due to patient leaving prior to being seen by health care provider: Secondary | ICD-10-CM | POA: Insufficient documentation

## 2016-05-25 DIAGNOSIS — R1010 Upper abdominal pain, unspecified: Secondary | ICD-10-CM | POA: Diagnosis present

## 2016-05-25 DIAGNOSIS — R079 Chest pain, unspecified: Secondary | ICD-10-CM | POA: Diagnosis not present

## 2016-05-25 LAB — COMPREHENSIVE METABOLIC PANEL
ALT: 14 U/L (ref 14–54)
AST: 17 U/L (ref 15–41)
Albumin: 4.1 g/dL (ref 3.5–5.0)
Alkaline Phosphatase: 64 U/L (ref 38–126)
Anion gap: 10 (ref 5–15)
BUN: 11 mg/dL (ref 6–20)
CO2: 24 mmol/L (ref 22–32)
Calcium: 9 mg/dL (ref 8.9–10.3)
Chloride: 103 mmol/L (ref 101–111)
Creatinine, Ser: 0.44 mg/dL (ref 0.44–1.00)
GFR calc Af Amer: >60 mL/min (ref >60)
GFR calc non Af Amer: >60 mL/min (ref >60)
Glucose, Bld: 102 mg/dL — ABNORMAL HIGH (ref 65–99)
Potassium: 3.5 mmol/L (ref 3.5–5.1)
Sodium: 137 mmol/L (ref 135–145)
Total Bilirubin: 0.7 mg/dL (ref 0.3–1.2)
Total Protein: 8.1 g/dL (ref 6.5–8.1)

## 2016-05-25 LAB — CBC
HCT: 43.4 % (ref 35.0–47.0)
Hemoglobin: 14.2 g/dL (ref 12.0–16.0)
MCH: 28.9 pg (ref 26.0–34.0)
MCHC: 32.7 g/dL (ref 32.0–36.0)
MCV: 88.2 fL (ref 80.0–100.0)
Platelets: 269 10*3/uL (ref 150–440)
RBC: 4.93 MIL/uL (ref 3.80–5.20)
RDW: 14.3 % (ref 11.5–14.5)
WBC: 11.9 10*3/uL — ABNORMAL HIGH (ref 3.6–11.0)

## 2016-05-25 LAB — LIPASE, BLOOD: Lipase: 26 U/L (ref 11–51)

## 2016-05-25 LAB — TROPONIN I: Troponin I: <0.03 ng/mL (ref <0.03)

## 2016-05-25 NOTE — ED Triage Notes (Signed)
Pt in with co chest pain states has had intermittently for years states tonight was persistent.

## 2016-05-25 NOTE — ED Triage Notes (Signed)
Pt also co upper abd pain, denies any vomiting or diarrhea.

## 2020-03-19 DIAGNOSIS — I10 Essential (primary) hypertension: Secondary | ICD-10-CM | POA: Diagnosis not present

## 2020-04-23 DIAGNOSIS — I10 Essential (primary) hypertension: Secondary | ICD-10-CM | POA: Diagnosis not present

## 2020-07-31 DIAGNOSIS — Z23 Encounter for immunization: Secondary | ICD-10-CM | POA: Diagnosis not present

## 2020-11-24 ENCOUNTER — Ambulatory Visit: Payer: Self-pay | Admitting: Family Medicine

## 2020-11-24 NOTE — Progress Notes (Deleted)
New patient visit   Patient: Wendy Simpson   DOB: 1976-06-25   45 y.o. Female  MRN: [id number] Visit Date: 11/24/2020  Today's healthcare provider: Vernie Murders, PA-C   No chief complaint on file.  Subjective    Wendy Simpson is a 45 y.o. female who presents today as a new patient to establish care.  HPI  ***  Past Medical History:  Diagnosis Date  . Anxiety   . Asthma    exercise induced  . Depression   . 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 Please review them in the "History" navigator section and refresh this [place]. Family Status  Relation Name Status  . Mother  Alive  . Father  Alive  . MGM  Deceased at age 73  . MGF  Alive   Family History  Problem Relation Age of Onset  . Cancer Mother 7       L BREAST  . Hypertension Mother 68  . Raynaud syndrome Mother        NOT SURE AGE  . Hypertension Father 48  . Kidney disease Maternal Grandfather    Social History   Socioeconomic History  . Marital status: Married    Spouse name: Not on file  . Number of children: Not on file  . Years of education: Not on file  . Highest education level: Not on file  Occupational History  . Not on file  Tobacco Use  . Smoking status: Never Smoker  . Smokeless tobacco: Not on file  Substance and Sexual Activity  . Alcohol use: Not on file  . Drug use: Not on file  . Sexual activity: Not on file  Other Topics Concern  . Not on file  Social History Narrative  . Not on file   Social Determinants of Health   Financial Resource Strain: Not on file  Food Insecurity: Not on file  Transportation Needs: Not on file  Physical Activity: Not on file  Stress: Not on file  Social Connections: Not on file   Outpatient Medications Prior to Visit  Medication Sig  . divalproex (DEPAKOTE ER) 250 MG 24 hr tablet Take 1 tablet (250 mg total) by mouth daily.  Marland Kitchen doxylamine, Sleep, (UNISOM) 25 MG tablet Take 25 mg by  mouth at bedtime as needed.   No facility-administered medications prior to visit.   Allergies  Allergen Reactions  . Codeine     REACTION: Rash    Immunization History  Administered Date(s) Administered  . Td 11/24/2005  . Tdap 11/24/2008    Health Maintenance  Topic Date Due  . Hepatitis C Screening  Never done  . HIV Screening  Never done  . PAP SMEAR-Modifier  08/07/2011  . TETANUS/TDAP  11/24/2018  . COLONOSCOPY (Pts 45-89yrs Insurance coverage will need to be confirmed)  Never done  . INFLUENZA VACCINE  Never done    Patient Care Team: Jinny Sanders, MD as PCP - General  Review of Systems  All other systems reviewed and are negative.   {Labs  Heme  Chem  Endocrine  Serology  Results Review (optional):[id number]::" "}  Objective    There were no vitals taken for this visit. Physical Exam ***  Depression Screen No flowsheet data found. No results found for any visits on 11/24/20.  Assessment & Plan     ***  No follow-ups on file.     {provider attestation***:1}  Vernie Murders, PA-C  [employer] [phone] (phone) [phone] (fax)  [place]

## 2021-01-19 DIAGNOSIS — Z0000 Encounter for general adult medical examination without abnormal findings: Secondary | ICD-10-CM | POA: Diagnosis not present

## 2021-01-19 DIAGNOSIS — Z1331 Encounter for screening for depression: Secondary | ICD-10-CM | POA: Diagnosis not present

## 2021-01-19 DIAGNOSIS — Z1339 Encounter for screening examination for other mental health and behavioral disorders: Secondary | ICD-10-CM | POA: Diagnosis not present

## 2021-01-19 DIAGNOSIS — Z13220 Encounter for screening for lipoid disorders: Secondary | ICD-10-CM | POA: Diagnosis not present

## 2021-01-19 DIAGNOSIS — Z20822 Contact with and (suspected) exposure to covid-19: Secondary | ICD-10-CM | POA: Diagnosis not present

## 2021-01-19 DIAGNOSIS — E559 Vitamin D deficiency, unspecified: Secondary | ICD-10-CM | POA: Diagnosis not present

## 2021-01-19 DIAGNOSIS — R5383 Other fatigue: Secondary | ICD-10-CM | POA: Diagnosis not present

## 2021-02-03 ENCOUNTER — Other Ambulatory Visit: Payer: Self-pay | Admitting: Family Medicine

## 2021-02-03 ENCOUNTER — Other Ambulatory Visit: Payer: Self-pay | Admitting: Internal Medicine

## 2021-02-03 DIAGNOSIS — Z1231 Encounter for screening mammogram for malignant neoplasm of breast: Secondary | ICD-10-CM

## 2021-02-05 ENCOUNTER — Other Ambulatory Visit: Payer: Self-pay

## 2021-02-05 ENCOUNTER — Ambulatory Visit
Admission: RE | Admit: 2021-02-05 | Discharge: 2021-02-05 | Disposition: A | Discharge status: Home / Self Care | Payer: Self-pay | Source: Ambulatory Visit | Attending: Family Medicine | Admitting: Family Medicine

## 2021-02-05 DIAGNOSIS — Z1231 Encounter for screening mammogram for malignant neoplasm of breast: Secondary | ICD-10-CM

## 2021-02-09 ENCOUNTER — Other Ambulatory Visit: Payer: Self-pay | Admitting: Internal Medicine

## 2021-02-09 DIAGNOSIS — R928 Other abnormal and inconclusive findings on diagnostic imaging of breast: Secondary | ICD-10-CM

## 2021-02-26 ENCOUNTER — Other Ambulatory Visit: Payer: Self-pay | Admitting: Internal Medicine

## 2021-02-26 DIAGNOSIS — R928 Other abnormal and inconclusive findings on diagnostic imaging of breast: Secondary | ICD-10-CM

## 2021-03-03 ENCOUNTER — Ambulatory Visit: Admission: RE | Admit: 2021-03-03 | Payer: BC Managed Care – PPO | Source: Ambulatory Visit

## 2021-03-03 ENCOUNTER — Other Ambulatory Visit: Payer: Self-pay

## 2021-03-03 ENCOUNTER — Other Ambulatory Visit: Payer: Self-pay | Admitting: Internal Medicine

## 2021-03-03 ENCOUNTER — Ambulatory Visit
Admission: RE | Admit: 2021-03-03 | Discharge: 2021-03-03 | Disposition: A | Discharge status: Home / Self Care | Payer: BC Managed Care – PPO | Source: Ambulatory Visit | Attending: Internal Medicine | Admitting: Internal Medicine

## 2021-03-03 ENCOUNTER — Ambulatory Visit: Payer: BC Managed Care – PPO

## 2021-03-03 DIAGNOSIS — R921 Mammographic calcification found on diagnostic imaging of breast: Secondary | ICD-10-CM | POA: Diagnosis not present

## 2021-03-03 DIAGNOSIS — R928 Other abnormal and inconclusive findings on diagnostic imaging of breast: Secondary | ICD-10-CM

## 2021-03-05 ENCOUNTER — Ambulatory Visit
Admission: RE | Admit: 2021-03-05 | Discharge: 2021-03-05 | Disposition: A | Discharge status: Home / Self Care | Payer: BC Managed Care – PPO | Source: Ambulatory Visit | Attending: Internal Medicine | Admitting: Internal Medicine

## 2021-03-05 ENCOUNTER — Other Ambulatory Visit: Payer: Self-pay

## 2021-03-05 DIAGNOSIS — N6092 Unspecified benign mammary dysplasia of left breast: Secondary | ICD-10-CM | POA: Diagnosis not present

## 2021-03-05 DIAGNOSIS — R921 Mammographic calcification found on diagnostic imaging of breast: Secondary | ICD-10-CM

## 2021-03-05 DIAGNOSIS — D0501 Lobular carcinoma in situ of right breast: Secondary | ICD-10-CM | POA: Diagnosis not present

## 2021-03-09 ENCOUNTER — Other Ambulatory Visit: Payer: Self-pay | Admitting: Internal Medicine

## 2021-03-09 DIAGNOSIS — D0501 Lobular carcinoma in situ of right breast: Secondary | ICD-10-CM

## 2021-03-12 ENCOUNTER — Ambulatory Visit
Admission: RE | Admit: 2021-03-12 | Discharge: 2021-03-12 | Disposition: A | Discharge status: Home / Self Care | Payer: BC Managed Care – PPO | Source: Ambulatory Visit | Attending: Internal Medicine | Admitting: Internal Medicine

## 2021-03-12 ENCOUNTER — Other Ambulatory Visit: Payer: Self-pay | Admitting: Internal Medicine

## 2021-03-12 ENCOUNTER — Other Ambulatory Visit: Payer: Self-pay

## 2021-03-12 DIAGNOSIS — D0501 Lobular carcinoma in situ of right breast: Secondary | ICD-10-CM

## 2021-03-12 DIAGNOSIS — N6489 Other specified disorders of breast: Secondary | ICD-10-CM | POA: Diagnosis not present

## 2021-03-12 DIAGNOSIS — R921 Mammographic calcification found on diagnostic imaging of breast: Secondary | ICD-10-CM | POA: Diagnosis not present

## 2021-03-12 DIAGNOSIS — N6021 Fibroadenosis of right breast: Secondary | ICD-10-CM | POA: Diagnosis not present

## 2021-03-16 ENCOUNTER — Other Ambulatory Visit: Payer: Self-pay | Admitting: Surgery

## 2021-03-16 DIAGNOSIS — N6489 Other specified disorders of breast: Secondary | ICD-10-CM

## 2021-03-16 DIAGNOSIS — D0501 Lobular carcinoma in situ of right breast: Secondary | ICD-10-CM

## 2021-03-17 ENCOUNTER — Other Ambulatory Visit: Payer: Self-pay | Admitting: Surgery

## 2021-03-17 DIAGNOSIS — D0501 Lobular carcinoma in situ of right breast: Secondary | ICD-10-CM

## 2021-03-17 DIAGNOSIS — N6489 Other specified disorders of breast: Secondary | ICD-10-CM

## 2021-04-06 ENCOUNTER — Encounter (HOSPITAL_BASED_OUTPATIENT_CLINIC_OR_DEPARTMENT_OTHER): Payer: Self-pay | Admitting: Surgery

## 2021-04-06 ENCOUNTER — Other Ambulatory Visit: Payer: Self-pay

## 2021-04-06 ENCOUNTER — Other Ambulatory Visit (HOSPITAL_COMMUNITY): Payer: Self-pay | Admitting: Surgery

## 2021-04-06 DIAGNOSIS — D0501 Lobular carcinoma in situ of right breast: Secondary | ICD-10-CM

## 2021-04-14 ENCOUNTER — Ambulatory Visit (HOSPITAL_COMMUNITY)
Admission: RE | Admit: 2021-04-14 | Discharge: 2021-04-14 | Disposition: A | Discharge status: Home / Self Care | Payer: BC Managed Care – PPO | Source: Ambulatory Visit | Attending: Surgery | Admitting: Surgery

## 2021-04-14 ENCOUNTER — Other Ambulatory Visit: Payer: Self-pay

## 2021-04-14 DIAGNOSIS — D0501 Lobular carcinoma in situ of right breast: Secondary | ICD-10-CM | POA: Insufficient documentation

## 2021-04-14 DIAGNOSIS — N6321 Unspecified lump in the left breast, upper outer quadrant: Secondary | ICD-10-CM | POA: Diagnosis not present

## 2021-04-14 DIAGNOSIS — R928 Other abnormal and inconclusive findings on diagnostic imaging of breast: Secondary | ICD-10-CM | POA: Diagnosis not present

## 2021-04-14 MED ORDER — GADOBUTROL 1 MMOL/ML IV SOLN
8.0000 mL | Freq: Once | INTRAVENOUS | Status: AC | PRN
  Administered 2021-04-14: 8 mL via INTRAVENOUS

## 2021-04-17 DIAGNOSIS — U071 COVID-19: Secondary | ICD-10-CM | POA: Diagnosis not present

## 2021-04-17 DIAGNOSIS — Z20822 Contact with and (suspected) exposure to covid-19: Secondary | ICD-10-CM | POA: Diagnosis not present

## 2021-04-17 DIAGNOSIS — R070 Pain in throat: Secondary | ICD-10-CM | POA: Diagnosis not present

## 2021-04-23 ENCOUNTER — Ambulatory Visit (HOSPITAL_BASED_OUTPATIENT_CLINIC_OR_DEPARTMENT_OTHER): Admission: RE | Admit: 2021-04-23 | Payer: BC Managed Care – PPO | Source: Home / Self Care | Admitting: Surgery

## 2021-04-23 HISTORY — DX: Essential (primary) hypertension: I10

## 2021-04-23 SURGERY — BREAST LUMPECTOMY WITH RADIOACTIVE SEED LOCALIZATION
Anesthesia: General | Site: Breast | Laterality: Bilateral

## 2021-04-29 ENCOUNTER — Encounter: Payer: Self-pay | Admitting: Plastic Surgery

## 2021-04-29 ENCOUNTER — Ambulatory Visit: Payer: BC Managed Care – PPO | Admitting: Plastic Surgery

## 2021-04-29 ENCOUNTER — Other Ambulatory Visit: Payer: Self-pay

## 2021-04-29 VITALS — BP 118/81 | HR 91 | Ht 64.0 in | Wt 180.0 lb

## 2021-04-29 DIAGNOSIS — D0500 Lobular carcinoma in situ of unspecified breast: Secondary | ICD-10-CM | POA: Diagnosis not present

## 2021-04-29 NOTE — Progress Notes (Signed)
Referring Provider Wendy Sanders, MD [place],  [place] [id number]   CC:  Chief Complaint  Patient presents with   Advice Only      Wendy Simpson is an 45 y.o. female.  HPI: Patient presents to discuss breast reconstruction.  She has had a number of biopsies recently for suspicious breast lesions.  She was unable to be adequately screened with mammogram alone due to the density of her breast and follow-up ultrasound in MRI have identified a number of different areas of concern.  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  Due to the number of areas of concern and the number of potential biopsies that would be required the patient is considering bilateral mastectomies.  She has been seeing Dr. Ninfa Simpson as her breast surgeon.  She is currently around a C cup and would like to be the same size or little bit larger.  She does not smoke and is not a diabetic.  She does report a remote history of blood clots but has not had any in at least 20 years and is not on any blood thinners.  Allergies  Allergen Reactions   Codeine     REACTION: Rash    Outpatient Encounter Medications as of 04/29/2021  Medication Sig   ID NOW COVID-19 KIT TEST AS DIRECTED TODAY   losartan-hydrochlorothiazide (HYZAAR) 50-12.5 MG tablet Take 1 tablet by mouth daily.   No facility-administered encounter medications on file as of 04/29/2021.     Past Medical History:  Diagnosis Date   Anxiety    Asthma    exercise induced   Depression    Hypertension    Stroke ([place]) 10/24/2001   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    Past Surgical History:  Procedure Laterality Date   [place]  07/2010   KIDNEY STONE SURGERY  07/2011   TUBAL LIGATION  2002    Family History  Problem Relation Age of Onset   Cancer Mother 79       L BREAST   Hypertension Mother 85   Raynaud syndrome Mother        NOT SURE AGE   Breast cancer Mother 80   Hypertension  Father 64   Kidney disease Maternal Grandfather     Social History   Social History Narrative   Not on file     Review of Systems General: Denies fevers, chills, weight loss CV: Denies chest pain, shortness of breath, palpitations  Physical Exam Vitals with BMI 04/29/2021 04/06/2021 05/25/2016  Height 5' 4"  5' 4"  5' 4"   Weight 180 lbs 178 lbs 160 lbs  BMI 30.88 27.74 12.8  Systolic 786 - 767  Diastolic 81 - 99  Pulse 91 - 88    General:  No acute distress,  Alert and oriented, Non-Toxic, Normal speech and affect Breast: She does not have much ptosis.  She is probably around a C cup.  She looks to be a bit larger on the right side but not by much.  No obvious scars of significance.  Base width is about 12 cm  Assessment/Plan Patient presents to discuss reconstruction after potential upcoming bilateral prophylactic mastectomies.  I think she is a good candidate for immediate reconstruction.  We discussed autologous and implant-based reconstruction and she favors implant-based which I think is a good choice for her.  We discussed placing tissue expanders versus direct implant and the pros  and cons of each.  Ultimately we both feel that tissue expander placement at the time of nipple nipple sparing mastectomy would give her the best chance of the result that she wants.  Particularly because she wants to be a bit larger.  I explained that this would be a stage reconstruction and that the tissue expander would be placed more than likely in the prepectoral plane at the time of the mastectomy.  She would subsequently be expanded until the point that she reached the size that she wants to be and then switched out for implants.  I do believe she is a candidate from her nipple sparing mastectomy from a reconstruction standpoint.  I explained the need for drains postoperatively.  I explained the general timeline of this process.  I will plan to reach out to Dr. Ninfa Simpson to make sure were all in the same  page.  I did discuss the risk of the procedure with her that include bleeding, infection, damage to surrounding structures and need for additional procedures.  I discussed the potential for healing complications that might result in failure of the reconstruction.  I do think she is overall a good candidate.  [name] 04/29/2021, 4:14 PM

## 2021-04-30 ENCOUNTER — Other Ambulatory Visit: Payer: Self-pay | Admitting: Surgery

## 2021-06-02 ENCOUNTER — Other Ambulatory Visit: Payer: Self-pay

## 2021-06-02 ENCOUNTER — Ambulatory Visit: Payer: BC Managed Care – PPO | Admitting: Internal Medicine

## 2021-06-02 ENCOUNTER — Ambulatory Visit (INDEPENDENT_AMBULATORY_CARE_PROVIDER_SITE_OTHER): Payer: BC Managed Care – PPO

## 2021-06-02 VITALS — BP 120/79 | HR 87 | Temp 97.7°F | Ht 64.0 in | Wt 172.0 lb

## 2021-06-02 DIAGNOSIS — D0500 Lobular carcinoma in situ of unspecified breast: Secondary | ICD-10-CM

## 2021-06-02 NOTE — H&P (View-Only) (Signed)
   Patient ID: Wendy Simpson, female    DOB: 12/07/1975, 45 y.o.   MRN: [id number]  No chief complaint on file.   No diagnosis found. Right breast cancer   History of Present Illness: Wendy Simpson is a 45 y.o.  female  with a history of right breast malignancy.  She presents for preoperative evaluation for upcoming procedure, bilateral mastectomies by Dr. Blackman & bilateral reconstruction with tissue expanders/acellular dermal matrix, scheduled for 06/15/21 with Dr. Pace.  The patient has had problems with anesthesia. PONV  Summary of Previous Visit: consult for breast reconstruction  Job: [employer]  PMH Significant for:  Right breast malignancy  Hx of kidney stones - last episode > 10 yrs ago PONV Hx of asthma as a child > 10 yrs ago- requiring inhaler-  d/t allergies/exercise induced - no symptoms as an adult & no supplemental use of oxygen.  She has HTN- which is controlled with medication. Personal hx of TIA at age 45- per pt d/t " blood clot at base of her brain "- unknown etiology/treated with hospitalization & blood thinners-but no complications since & she is no longer on anticoagulants. She did report that she was at work at the time of these episodes & she experienced confusion/facial drooping & speech difficulty.  None of these symptoms since. She does have small varicose veins- left upper leg/thigh areas- she states they are mildly painful.at times. Family hx of lower extremity blood clots- her mother & maternal grandfather- both requiring intervention.  Hx of panic attacks/anxiety- which was dx 5 yrs ago when she had "fast heart rate" - which she had EKG/ECHO- both were negative. She had an EKG April 2022- during her yearly physical- was negative for any cardiac complication.  She did mention a sensitivity to epinephrine - she states that she gets "shaky & feels as if she is going to faint " - this occurred when she had recent breast biopsies. BMI =  29.52    Past Medical History: Allergies: Allergies  Allergen Reactions   Codeine     REACTION: Rash    Current Medications:  Current Outpatient Medications:    ID NOW COVID-19 KIT, TEST AS DIRECTED TODAY, Disp: , Rfl:    losartan-hydrochlorothiazide (HYZAAR) 50-12.5 MG tablet, Take 1 tablet by mouth daily., Disp: , Rfl:   Past Medical Problems: Past Medical History:  Diagnosis Date   Anxiety    Asthma    exercise induced   Depression    Hypertension    Stroke ([facility]) 10/24/2001   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    Past Surgical History: Past Surgical History:  Procedure Laterality Date   CESAREAN SECTION  1995   GALLBLADDER SURGERY  07/2010   KIDNEY STONE SURGERY  07/2011   TUBAL LIGATION  2002    Social History: Social History   Socioeconomic History   Marital status: Married    Spouse name: Not on file   Number of children: Not on file   Years of education: Not on file   Highest education level: Not on file  Occupational History   Not on file  Tobacco Use   Smoking status: Never   Smokeless tobacco: Never  Vaping Use   Vaping Use: Never used  Substance and Sexual Activity   Alcohol use: Not on file   Drug use: Never   Sexual activity: Never  Other Topics Concern   Not on file  Social History Narrative   Not on file     Social Determinants of Health   Financial Resource Strain: Not on file  Food Insecurity: Not on file  Transportation Needs: Not on file  Physical Activity: Not on file  Stress: Not on file  Social Connections: Not on file  Intimate Partner Violence: Not on file    Family History: Family History  Problem Relation Age of Onset   Cancer Mother 33       L BREAST   Hypertension Mother 42   Raynaud syndrome Mother        NOT SURE AGE   Breast cancer Mother 42   Hypertension Father 39   Kidney disease Maternal Grandfather     Review of Systems: ROS She has had hx of asthma as a child Does not have sleep apnea TIA- hx & HTN No  GI complications Right breast malignancy- & left breast lesions noted on last mammogram 3-D /4-D in April 2022 No radiation/chemo planned She does not smoke & does not have DM  Physical Exam: Vital Signs BP 120/79 (BP Location: Right Arm, Patient Position: Sitting)   Pulse 87   Temp 97.7 F (36.5 C) (Temporal)   Ht _0  (1.626 m)   Wt 172 lb (78 kg)   LMP 05/24/2021 (Exact Date) Comment: tubal ligation  SpO2 96%   BMI 29.52 kg/m   Physical Exam   Constitutional:      General: Not in acute distress.    Appearance: Normal appearance. Not ill-appearing.  HENT:     Head: Normocephalic and atraumatic.  Eyes:     Pupils: Pupils are equal, round Neck:     Musculoskeletal: Normal range of motion.  Cardiovascular:     Rate and Rhythm: Normal rate    Pulses: Normal pulses.  Small varicose veins noted in left upper leg/thigh area- which are slightly painful at times- but no noted swelling of legs/feet or ankles Pulmonary:     Effort: Pulmonary effort is normal. No respiratory distress.  Abdominal:     General: Abdomen is flat. There is no distension.  Musculoskeletal: Normal range of motion.  Skin:    General: Skin is warm and dry.     Findings: No erythema or rash.  Neurological:     General: No focal deficit present.     Mental Status: Alert and oriented to person, place, and time. Mental status is at baseline.     Motor: No weakness.  Psychiatric:        Mood and Affect: Mood normal.        Behavior: Behavior normal.    Assessment/Plan: The patient is scheduled for bilateral breast reconstruction with tissue expanders/a-cellular dermal matrix with Dr. Claudia Desanctis.  Risks, benefits, and alternatives of procedure discussed, questions answered and consent obtained.    Smoking Status: non- smoker; Counseling Given? N/A Last Mammogram: 01/2021; Results: right breast malignancy  Caprini Score: 10 -13; Risk Factors include: , BMI = 29.52  female, age, hx of malignancy, hx of  cerebral vascular complicatons- TIA's, visible varicose veins, family hx of DVT, and length of planned surgery.  Recommendation for mechanical SCD prophylaxis. Encourage early ambulation.   Pictures obtained: _1   Post-op Rx sent to pharmacy: -yes by Dr. Claudia Desanctis  Patient was provided with the  General Surgical Risk consent document and Pain Medication Agreement prior to their appointment.  They had adequate time to read through the risk consent documents and Pain Medication Agreement. We also discussed them in person together during this preop appointment. All of their questions were  answered to their satisfaction.  Recommended calling if they have any further questions.  Risk consent form and Pain Medication Agreement to be scanned into patient's chart.   Patient is currently a B-C cup bra   Electronically signed by: [place], RN 06/02/2021 9:42 AM

## 2021-06-02 NOTE — Progress Notes (Signed)
Patient ID: Wendy Simpson, female    DOB: [birth year], 45 y.o.   MRN: [id number]  No chief complaint on file.   No diagnosis found. Right breast cancer   History of Present Illness: Wendy Simpson is a 45 y.o.  female  with a history of right breast malignancy.  She presents for preoperative evaluation for upcoming procedure, bilateral mastectomies by Dr. Ninfa Linden & bilateral reconstruction with tissue expanders/acellular dermal matrix, scheduled for 06/15/21 with Dr. Claudia Desanctis.  The patient has had problems with anesthesia. PONV  Summary of Previous Visit: consult for breast reconstruction  Job: [employer]  PMH Significant for:  Right breast malignancy  Hx of kidney stones - last episode > 10 yrs ago PONV Hx of asthma as a child > 10 yrs ago- requiring inhaler-  d/t allergies/exercise induced - no symptoms as an adult & no supplemental use of oxygen.  She has HTN- which is controlled with medication. Personal hx of TIA at age 84- per pt d/t " blood clot at base of her brain "- unknown etiology/treated with hospitalization & blood thinners-but no complications since & she is no longer on anticoagulants. She did report that she was at work at the time of these episodes & she experienced confusion/facial drooping & speech difficulty.  None of these symptoms since. She does have small varicose veins- left upper leg/thigh areas- she states they are mildly painful.at times. Family hx of lower extremity blood clots- her mother & maternal grandfather- both requiring intervention.  Hx of panic attacks/anxiety- which was dx 5 yrs ago when she had "fast heart rate" - which she had EKG/ECHO- both were negative. She had an EKG April 2022- during her yearly physical- was negative for any cardiac complication.  She did mention a sensitivity to epinephrine - she states that she gets "shaky & feels as if she is going to faint " - this occurred when she had recent breast biopsies. BMI =  29.52    Past Medical History: Allergies: Allergies  Allergen Reactions   Codeine     REACTION: Rash    Current Medications:  Current Outpatient Medications:    ID NOW COVID-19 KIT, TEST AS DIRECTED TODAY, Disp: , Rfl:    losartan-hydrochlorothiazide (HYZAAR) 50-12.5 MG tablet, Take 1 tablet by mouth daily., Disp: , Rfl:   Past Medical Problems: Past Medical History:  Diagnosis Date   Anxiety    Asthma    exercise induced   Depression    Hypertension    Stroke ([place]) 10/24/2001   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    Past Surgical History: Past Surgical History:  Procedure Laterality Date   [place] SURGERY  07/2010   KIDNEY STONE SURGERY  07/2011   TUBAL LIGATION  2002    Social History: Social History   Socioeconomic History   Marital status: Married    Spouse name: Not on file   Number of children: Not on file   Years of education: Not on file   Highest education level: Not on file  Occupational History   Not on file  Tobacco Use   Smoking status: Never   Smokeless tobacco: Never  Vaping Use   Vaping Use: Never used  Substance and Sexual Activity   Alcohol use: Not on file   Drug use: Never   Sexual activity: Never  Other Topics Concern   Not on file  Social History Narrative   Not on file  Social Determinants of Health   Financial Resource Strain: Not on file  Food Insecurity: Not on file  Transportation Needs: Not on file  Physical Activity: Not on file  Stress: Not on file  Social Connections: Not on file  Intimate Partner Violence: Not on file    Family History: Family History  Problem Relation Age of Onset   Cancer Mother 70       L BREAST   Hypertension Mother 98   Raynaud syndrome Mother        NOT SURE AGE   Breast cancer Mother 36   Hypertension Father 63   Kidney disease Maternal Grandfather     Review of Systems: ROS She has had hx of asthma as a child Does not have sleep apnea TIA- hx & HTN No  GI complications Right breast malignancy- & left breast lesions noted on last mammogram 3-D /4-D in April 2022 No radiation/chemo planned She does not smoke & does not have DM  Physical Exam: Vital Signs BP 120/79 (BP Location: Right Arm, Patient Position: Sitting)   Pulse 87   Temp 97.7 F (36.5 C) (Temporal)   Ht _0  (1.626 m)   Wt 172 lb (78 kg)   LMP 05/24/2021 (Exact Date) Comment: tubal ligation  SpO2 96%   BMI 29.52 kg/m   Physical Exam   Constitutional:      General: Not in acute distress.    Appearance: Normal appearance. Not ill-appearing.  HENT:     Head: Normocephalic and atraumatic.  Eyes:     Pupils: Pupils are equal, round Neck:     Musculoskeletal: Normal range of motion.  Cardiovascular:     Rate and Rhythm: Normal rate    Pulses: Normal pulses.  Small varicose veins noted in left upper leg/thigh area- which are slightly painful at times- but no noted swelling of legs/feet or ankles Pulmonary:     Effort: Pulmonary effort is normal. No respiratory distress.  Abdominal:     General: Abdomen is flat. There is no distension.  Musculoskeletal: Normal range of motion.  Skin:    General: Skin is warm and dry.     Findings: No erythema or rash.  Neurological:     General: No focal deficit present.     Mental Status: Alert and oriented to person, place, and time. Mental status is at baseline.     Motor: No weakness.  Psychiatric:        Mood and Affect: Mood normal.        Behavior: Behavior normal.    Assessment/Plan: The patient is scheduled for bilateral breast reconstruction with tissue expanders/a-cellular dermal matrix with Dr. Claudia Desanctis.  Risks, benefits, and alternatives of procedure discussed, questions answered and consent obtained.    Smoking Status: non- smoker; Counseling Given? N/A Last Mammogram: 01/2021; Results: right breast malignancy  Caprini Score: 10 -13; Risk Factors include: , BMI = 29.52  female, age, hx of malignancy, hx of  cerebral vascular complicatons- TIA's, visible varicose veins, family hx of DVT, and length of planned surgery.  Recommendation for mechanical SCD prophylaxis. Encourage early ambulation.   Pictures obtained: _1   Post-op Rx sent to pharmacy: -yes by Dr. Claudia Desanctis  Patient was provided with the  General Surgical Risk consent document and Pain Medication Agreement prior to their appointment.  They had adequate time to read through the risk consent documents and Pain Medication Agreement. We also discussed them in person together during this preop appointment. All of their questions were  answered to their satisfaction.  Recommended calling if they have any further questions.  Risk consent form and Pain Medication Agreement to be scanned into patient's chart.   Patient is currently a B-C cup bra   Electronically signed by: [place], RN 06/02/2021 9:42 AM

## 2021-06-02 NOTE — Patient Instructions (Signed)
Patient is reminded to call for any changes/concerns or further questions She was given a copy of her surgical schedule/postop appointments & a signed copy of her consent.

## 2021-06-07 ENCOUNTER — Other Ambulatory Visit: Payer: Self-pay

## 2021-06-07 ENCOUNTER — Encounter (HOSPITAL_BASED_OUTPATIENT_CLINIC_OR_DEPARTMENT_OTHER): Payer: Self-pay | Admitting: Surgery

## 2021-06-11 ENCOUNTER — Encounter (HOSPITAL_BASED_OUTPATIENT_CLINIC_OR_DEPARTMENT_OTHER)
Admission: RE | Admit: 2021-06-11 | Discharge: 2021-06-11 | Disposition: A | Discharge status: Home / Self Care | Payer: BC Managed Care – PPO | Source: Ambulatory Visit | Attending: Surgery | Admitting: Surgery

## 2021-06-11 DIAGNOSIS — Z01818 Encounter for other preprocedural examination: Secondary | ICD-10-CM | POA: Insufficient documentation

## 2021-06-11 LAB — BASIC METABOLIC PANEL
Anion gap: 5 (ref 5–15)
BUN: 12 mg/dL (ref 6–20)
CO2: 25 mmol/L (ref 22–32)
Calcium: 8.7 mg/dL — ABNORMAL LOW (ref 8.9–10.3)
Chloride: 106 mmol/L (ref 98–111)
Creatinine, Ser: 0.51 mg/dL (ref 0.44–1.00)
GFR, Estimated: >60 mL/min (ref >60)
Glucose, Bld: 107 mg/dL — ABNORMAL HIGH (ref 70–99)
Potassium: 4.3 mmol/L (ref 3.5–5.1)
Sodium: 136 mmol/L (ref 135–145)

## 2021-06-11 MED ORDER — ENSURE PRE-SURGERY PO LIQD: 296.0000 mL | Freq: Once | ORAL | Status: DC

## 2021-06-11 NOTE — Progress Notes (Signed)

## 2021-06-14 ENCOUNTER — Other Ambulatory Visit: Payer: Self-pay | Admitting: Surgery

## 2021-06-14 NOTE — H&P (Signed)
Wendy Simpson  Location: [facility] Patient #: [id number] DOB: 08-Jun-1976 Married / Language: English / Race: White Female   History of Present Illness  The patient is a 45 year old female who presents with a complaint of Breast problems.  Chief complaint: Multiple lesions bilateral breasts  This is a 45 year old female found to have multiple abnormalities with calcifications in both her breasts.  She ended up having 3 biopsies in the right breast and one biopsy left breast.  The right breast showed 2 complex sclerosing lesions as well as an area of lobular carcinoma in situ.  Left breast showed an area complex sclerosing lesion and focal lobular neoplasia as well.  She has had no previous problems regarding her breasts.  She does have a family history of breast cancer in her mother.  She denies nipple discharge.  She is otherwise healthy without complaints.   Past Surgical History Cesarean Section - 1   Gallbladder Surgery - Laparoscopic    Diagnostic Studies History [facility], [place]; Mar 17, 2021 3:00 PM) Colonoscopy   never Mammogram   within last year Pap Smear   1-5 years ago  Social History ( Alcohol use   Occasional alcohol use. Caffeine use   Carbonated beverages, Tea. No drug use   Tobacco use   Never smoker.  Family History  Cerebrovascular Accident   Mother. Diabetes Mellitus   Mother. Hypertension   Father.  Pregnancy / Birth History  Age at menarche   39 years. Gravida   2 Maternal age   34-20 Para   2 Regular periods    Other Problems  Anxiety Disorder   Cerebrovascular Accident   Cholelithiasis   Kidney Stone      Review of Systems  General Not Present- Appetite Loss, Chills, Fatigue, Fever, Night Sweats, Weight Gain and Weight Loss. Skin Not Present- Change in Wart/Mole, Dryness, Hives, Jaundice, New Lesions, Non-Healing Wounds, Rash and Ulcer. HEENT Present- Seasonal Allergies. Not Present- Earache, Hearing Loss, Hoarseness, Nose  Bleed, Oral Ulcers, Ringing in the Ears, Sinus Pain, Sore Throat, Visual Disturbances, Wears glasses/contact lenses and Yellow Eyes. Respiratory Not Present- Bloody sputum, Chronic Cough, Difficulty Breathing, Snoring and Wheezing. Cardiovascular Not Present- Chest Pain, Difficulty Breathing Lying Down, Leg Cramps, Palpitations, Rapid Heart Rate, Shortness of Breath and Swelling of Extremities. Gastrointestinal Not Present- Abdominal Pain, Bloating, Bloody Stool, Change in Bowel Habits, Chronic diarrhea, Constipation, Difficulty Swallowing, Excessive gas, Gets full quickly at meals, Hemorrhoids, Indigestion, Nausea, Rectal Pain and Vomiting. Female Genitourinary Not Present- Frequency, Nocturia, Painful Urination, Pelvic Pain and Urgency. Musculoskeletal Not Present- Back Pain, Joint Pain, Joint Stiffness, Muscle Pain, Muscle Weakness and Swelling of Extremities. Neurological Not Present- Decreased Memory, Fainting, Headaches, Numbness, Seizures, Tingling, Tremor, Trouble walking and Weakness. Psychiatric Present- Anxiety. Not Present- Bipolar, Change in Sleep Pattern, Depression, Fearful and Frequent crying. Endocrine Not Present- Cold Intolerance, Excessive Hunger, Hair Changes, Heat Intolerance, Hot flashes and New Diabetes. Hematology Not Present- Blood Thinners, Easy Bruising, Excessive bleeding, Gland problems, HIV and Persistent Infections.  Vitals  Weight: 179.13 lb   Height: 64 in  Body Surface Area: 1.87 m   Body Mass Index: 30.75 kg/m   Temp.: 97.1 F    Pulse: 109 (Regular)    P.OX: 99% (Room air) BP: 140/82(Sitting, Left Arm, Standard)     Physical Exam  The physical exam findings are as follows: Note:  She appears well exam.  Breasts appear normal other than mildly bruised from the biopsies. The nipple areolar complexes  are normal. There are no palpable masses and no axillary adenopathy.  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 I reviewed her mammograms, ultrasound, and pathology results. She has an area of lobular carcinoma in situ of the right breast as well as 2 complex sclerosing lesions and another complex sclerosing lesion in the left breast. Excision of all these areas is recommended. I did discuss getting a preoperative MRI with her she does not want to do this and have further biopsies. She would rather proceed with removal of all these areas and make a decision later depending on the final pathology results which I discussed with her in detail. All the areas need to be removed for complete histologic evaluation. This would be with radioactive seed guided lumpectomies removing the 3 areas in the right breast and one in the left breast. I discussed the surgical procedure with her in detail. We discussed the risk which includes but is not limited to bleeding, infection, injury to surrounding structures, the need for further procedures if malignancy is found, cardiopulmonary issues, postoperative recovery, etc. She understands and wished to proceed with surgery which will be scheduled.  Addendum:  the patient ended up deciding to have bilateral breast MRI's.  2 more suspicious areas were identified and biopsy of both was recommended. After a long discussion, she wants to forego further biopsies and proceed with nipple sparing bilateral mastectomies.  We discussed the risks in detail.  These include but are not limited to bleeding, infection, the need for further procedures, nipple necrosis, cardiopulmonary issues, etc. She will be referred to plastic surgery and we will proceed with bilateral nipple sparing mastectomies with immediate reconstruction.

## 2021-06-15 ENCOUNTER — Encounter (HOSPITAL_BASED_OUTPATIENT_CLINIC_OR_DEPARTMENT_OTHER): Payer: Self-pay | Admitting: Surgery

## 2021-06-15 ENCOUNTER — Ambulatory Visit (HOSPITAL_BASED_OUTPATIENT_CLINIC_OR_DEPARTMENT_OTHER): Payer: BC Managed Care – PPO | Admitting: Anesthesiology

## 2021-06-15 ENCOUNTER — Observation Stay (HOSPITAL_BASED_OUTPATIENT_CLINIC_OR_DEPARTMENT_OTHER)
Admission: RE | Admit: 2021-06-15 | Discharge: 2021-06-16 | Disposition: A | Discharge status: Home / Self Care | Payer: BC Managed Care – PPO | Attending: Plastic Surgery | Admitting: Plastic Surgery

## 2021-06-15 ENCOUNTER — Other Ambulatory Visit: Payer: Self-pay

## 2021-06-15 ENCOUNTER — Encounter (HOSPITAL_BASED_OUTPATIENT_CLINIC_OR_DEPARTMENT_OTHER)
Admission: RE | Disposition: A | Discharge status: Home / Self Care | Payer: Self-pay | Source: Home / Self Care | Attending: Plastic Surgery

## 2021-06-15 DIAGNOSIS — Z803 Family history of malignant neoplasm of breast: Secondary | ICD-10-CM | POA: Diagnosis not present

## 2021-06-15 DIAGNOSIS — N6489 Other specified disorders of breast: Secondary | ICD-10-CM | POA: Insufficient documentation

## 2021-06-15 DIAGNOSIS — Z1501 Genetic susceptibility to malignant neoplasm of breast: Secondary | ICD-10-CM | POA: Diagnosis not present

## 2021-06-15 DIAGNOSIS — N6021 Fibroadenosis of right breast: Secondary | ICD-10-CM | POA: Diagnosis not present

## 2021-06-15 DIAGNOSIS — I10 Essential (primary) hypertension: Secondary | ICD-10-CM | POA: Diagnosis not present

## 2021-06-15 DIAGNOSIS — Z421 Encounter for breast reconstruction following mastectomy: Secondary | ICD-10-CM | POA: Diagnosis not present

## 2021-06-15 DIAGNOSIS — D242 Benign neoplasm of left breast: Secondary | ICD-10-CM | POA: Diagnosis not present

## 2021-06-15 DIAGNOSIS — D0500 Lobular carcinoma in situ of unspecified breast: Secondary | ICD-10-CM | POA: Diagnosis not present

## 2021-06-15 DIAGNOSIS — J45990 Exercise induced bronchospasm: Secondary | ICD-10-CM | POA: Diagnosis not present

## 2021-06-15 DIAGNOSIS — C50919 Malignant neoplasm of unspecified site of unspecified female breast: Secondary | ICD-10-CM | POA: Diagnosis present

## 2021-06-15 DIAGNOSIS — Z4001 Encounter for prophylactic removal of breast: Secondary | ICD-10-CM | POA: Diagnosis not present

## 2021-06-15 DIAGNOSIS — D0501 Lobular carcinoma in situ of right breast: Secondary | ICD-10-CM | POA: Diagnosis not present

## 2021-06-15 DIAGNOSIS — N6011 Diffuse cystic mastopathy of right breast: Secondary | ICD-10-CM | POA: Diagnosis not present

## 2021-06-15 HISTORY — PX: NIPPLE SPARING MASTECTOMY: SHX6537

## 2021-06-15 HISTORY — DX: Nausea with vomiting, unspecified: R11.2

## 2021-06-15 HISTORY — DX: Other specified postprocedural states: Z98.890

## 2021-06-15 HISTORY — PX: BREAST RECONSTRUCTION WITH PLACEMENT OF TISSUE EXPANDER AND FLEX HD (ACELLULAR HYDRATED DERMIS): SHX6295

## 2021-06-15 LAB — POCT PREGNANCY, URINE: Preg Test, Ur: NEGATIVE

## 2021-06-15 SURGERY — MASTECTOMY, NIPPLE SPARING
Anesthesia: General | Site: Breast | Laterality: Bilateral

## 2021-06-15 MED ORDER — FENTANYL CITRATE (PF) 100 MCG/2ML IJ SOLN
INTRAMUSCULAR | Status: AC
  Filled 2021-06-15: qty 2

## 2021-06-15 MED ORDER — ATROPINE SULFATE 0.4 MG/ML IJ SOLN
INTRAMUSCULAR | Status: AC
  Filled 2021-06-15: qty 1

## 2021-06-15 MED ORDER — ENSURE PRE-SURGERY PO LIQD
296.0000 mL | Freq: Once | ORAL | Status: DC
Start: 2021-06-16 — End: 2021-06-15

## 2021-06-15 MED ORDER — CHLORHEXIDINE GLUCONATE CLOTH 2 % EX PADS: 6.0000 | MEDICATED_PAD | Freq: Once | CUTANEOUS | Status: DC

## 2021-06-15 MED ORDER — CEFAZOLIN SODIUM-DEXTROSE 2-4 GM/100ML-% IV SOLN
2.0000 g | INTRAVENOUS | Status: AC
Start: 2021-06-15 — End: 2021-06-15
  Administered 2021-06-15: 2 g via INTRAVENOUS

## 2021-06-15 MED ORDER — SULFAMETHOXAZOLE-TRIMETHOPRIM 800-160 MG PO TABS
1.0000 | ORAL_TABLET | Freq: Two times a day (BID) | ORAL | Status: DC
  Filled 2021-06-15: qty 1

## 2021-06-15 MED ORDER — CEFAZOLIN SODIUM-DEXTROSE 2-4 GM/100ML-% IV SOLN: 2.0000 g | INTRAVENOUS | Status: DC

## 2021-06-15 MED ORDER — DIPHENHYDRAMINE HCL 50 MG/ML IJ SOLN
INTRAMUSCULAR | Status: AC
  Filled 2021-06-15: qty 1

## 2021-06-15 MED ORDER — AMISULPRIDE (ANTIEMETIC) 5 MG/2ML IV SOLN
10.0000 mg | Freq: Once | INTRAVENOUS | Status: AC | PRN
  Administered 2021-06-15: 10 mg via INTRAVENOUS

## 2021-06-15 MED ORDER — SUGAMMADEX SODIUM 200 MG/2ML IV SOLN: INTRAVENOUS | Status: DC | PRN

## 2021-06-15 MED ORDER — FENTANYL CITRATE (PF) 100 MCG/2ML IJ SOLN
INTRAMUSCULAR | Status: DC | PRN
  Administered 2021-06-15: 50 ug via INTRAVENOUS
  Administered 2021-06-15 (×2): 100 ug via INTRAVENOUS
  Administered 2021-06-15: 50 ug via INTRAVENOUS

## 2021-06-15 MED ORDER — POLYETHYLENE GLYCOL 3350 17 G PO PACK: 17.0000 g | PACK | Freq: Every day | ORAL | Status: DC | PRN

## 2021-06-15 MED ORDER — LIDOCAINE HCL (CARDIAC) PF 100 MG/5ML IV SOSY
PREFILLED_SYRINGE | INTRAVENOUS | Status: DC | PRN
  Administered 2021-06-15: 50 mg via INTRAVENOUS

## 2021-06-15 MED ORDER — ROCURONIUM BROMIDE 10 MG/ML (PF) SYRINGE
PREFILLED_SYRINGE | INTRAVENOUS | Status: AC
  Filled 2021-06-15: qty 10

## 2021-06-15 MED ORDER — NITROGLYCERIN 2 % TD OINT
TOPICAL_OINTMENT | TRANSDERMAL | Status: DC | PRN
  Administered 2021-06-15: 1 [in_us] via TOPICAL

## 2021-06-15 MED ORDER — PHENYLEPHRINE 40 MCG/ML (10ML) SYRINGE FOR IV PUSH (FOR BLOOD PRESSURE SUPPORT)
PREFILLED_SYRINGE | INTRAVENOUS | Status: AC
  Filled 2021-06-15: qty 10

## 2021-06-15 MED ORDER — HYDROCODONE-ACETAMINOPHEN 5-325 MG PO TABS
1.0000 | ORAL_TABLET | ORAL | Status: DC | PRN
  Administered 2021-06-15 (×2): 1 via ORAL
  Administered 2021-06-16: 2 via ORAL
  Filled 2021-06-15 (×2): qty 1
  Filled 2021-06-15: qty 2

## 2021-06-15 MED ORDER — MIDAZOLAM HCL 5 MG/5ML IJ SOLN
INTRAMUSCULAR | Status: DC | PRN
  Administered 2021-06-15: 2 mg via INTRAVENOUS

## 2021-06-15 MED ORDER — AMISULPRIDE (ANTIEMETIC) 5 MG/2ML IV SOLN
INTRAVENOUS | Status: AC
  Filled 2021-06-15: qty 2

## 2021-06-15 MED ORDER — MIDAZOLAM HCL 2 MG/2ML IJ SOLN
INTRAMUSCULAR | Status: AC
  Filled 2021-06-15: qty 2

## 2021-06-15 MED ORDER — EPHEDRINE SULFATE 50 MG/ML IJ SOLN: INTRAMUSCULAR | Status: DC | PRN

## 2021-06-15 MED ORDER — PHENYLEPHRINE HCL (PRESSORS) 10 MG/ML IV SOLN
INTRAVENOUS | Status: DC | PRN
  Administered 2021-06-15: 40 ug via INTRAVENOUS

## 2021-06-15 MED ORDER — APREPITANT 40 MG PO CAPS
ORAL_CAPSULE | ORAL | Status: AC
  Filled 2021-06-15: qty 1

## 2021-06-15 MED ORDER — CEFAZOLIN SODIUM-DEXTROSE 2-4 GM/100ML-% IV SOLN
INTRAVENOUS | Status: AC
  Filled 2021-06-15: qty 100

## 2021-06-15 MED ORDER — LIDOCAINE HCL (PF) 2 % IJ SOLN
INTRAMUSCULAR | Status: AC
  Filled 2021-06-15: qty 5

## 2021-06-15 MED ORDER — ONDANSETRON HCL 4 MG/2ML IJ SOLN
INTRAMUSCULAR | Status: AC
  Filled 2021-06-15: qty 2

## 2021-06-15 MED ORDER — NITROGLYCERIN 2 % TD OINT
1.0000 [in_us] | TOPICAL_OINTMENT | Freq: Two times a day (BID) | TRANSDERMAL | Status: DC
  Administered 2021-06-16: 1 [in_us] via TOPICAL
  Filled 2021-06-15: qty 30

## 2021-06-15 MED ORDER — BUPIVACAINE HCL (PF) 0.25 % IJ SOLN
INTRAMUSCULAR | Status: DC | PRN
  Administered 2021-06-15: 30 mL

## 2021-06-15 MED ORDER — DEXAMETHASONE SODIUM PHOSPHATE 4 MG/ML IJ SOLN
INTRAMUSCULAR | Status: DC | PRN
  Administered 2021-06-15: 10 mg via INTRAVENOUS

## 2021-06-15 MED ORDER — ACETAMINOPHEN 500 MG PO TABS: 500.0000 mg | ORAL_TABLET | Freq: Once | ORAL | Status: DC

## 2021-06-15 MED ORDER — BUPIVACAINE-EPINEPHRINE (PF) 0.25% -1:200000 IJ SOLN
INTRAMUSCULAR | Status: AC
  Filled 2021-06-15: qty 30

## 2021-06-15 MED ORDER — ONDANSETRON HCL 4 MG/2ML IJ SOLN
INTRAMUSCULAR | Status: DC | PRN
  Administered 2021-06-15: 4 mg via INTRAVENOUS

## 2021-06-15 MED ORDER — BUPIVACAINE LIPOSOME 1.3 % IJ SUSP
INTRAMUSCULAR | Status: DC | PRN
  Administered 2021-06-15: 20 mL

## 2021-06-15 MED ORDER — SODIUM CHLORIDE 0.9 % IV SOLN
INTRAVENOUS | Status: DC | PRN
  Administered 2021-06-15: 500 mL

## 2021-06-15 MED ORDER — BUPIVACAINE LIPOSOME 1.3 % IJ SUSP
INTRAMUSCULAR | Status: AC
  Filled 2021-06-15: qty 20

## 2021-06-15 MED ORDER — DEXAMETHASONE SODIUM PHOSPHATE 10 MG/ML IJ SOLN
INTRAMUSCULAR | Status: AC
  Filled 2021-06-15: qty 1

## 2021-06-15 MED ORDER — BUPIVACAINE HCL (PF) 0.25 % IJ SOLN
INTRAMUSCULAR | Status: AC
  Filled 2021-06-15: qty 30

## 2021-06-15 MED ORDER — PROPOFOL 10 MG/ML IV BOLUS
INTRAVENOUS | Status: DC | PRN
Start: 2021-06-15 — End: 2021-06-15
  Administered 2021-06-15: 150 mg via INTRAVENOUS

## 2021-06-15 MED ORDER — ONDANSETRON 4 MG PO TBDP: 4.0000 mg | ORAL_TABLET | Freq: Once | ORAL | Status: DC

## 2021-06-15 MED ORDER — FENTANYL CITRATE (PF) 100 MCG/2ML IJ SOLN
25.0000 ug | INTRAMUSCULAR | Status: DC | PRN
Start: 2021-06-15 — End: 2021-06-16
  Administered 2021-06-15: 25 ug via INTRAVENOUS

## 2021-06-15 MED ORDER — MORPHINE SULFATE (PF) 4 MG/ML IV SOLN: 1.0000 mg | INTRAVENOUS | Status: DC | PRN

## 2021-06-15 MED ORDER — ACETAMINOPHEN 500 MG PO TABS
1000.0000 mg | ORAL_TABLET | ORAL | Status: AC
  Administered 2021-06-15: 1000 mg via ORAL

## 2021-06-15 MED ORDER — SCOPOLAMINE 1 MG/3DAYS TD PT72
1.0000 | MEDICATED_PATCH | TRANSDERMAL | Status: DC
  Administered 2021-06-15: 1.5 mg via TRANSDERMAL

## 2021-06-15 MED ORDER — DIPHENHYDRAMINE HCL 25 MG PO CAPS: 25.0000 mg | ORAL_CAPSULE | Freq: Four times a day (QID) | ORAL | Status: DC | PRN

## 2021-06-15 MED ORDER — KETOROLAC TROMETHAMINE 30 MG/ML IJ SOLN: 30.0000 mg | Freq: Once | INTRAMUSCULAR | Status: AC | PRN

## 2021-06-15 MED ORDER — APREPITANT 40 MG PO CAPS
40.0000 mg | ORAL_CAPSULE | Freq: Once | ORAL | Status: AC
  Administered 2021-06-15: 40 mg via ORAL

## 2021-06-15 MED ORDER — ACETAMINOPHEN 500 MG PO TABS
ORAL_TABLET | ORAL | Status: AC
  Filled 2021-06-15: qty 2

## 2021-06-15 MED ORDER — PROMETHAZINE HCL 25 MG PO TABS: 25.0000 mg | ORAL_TABLET | Freq: Once | ORAL | Status: DC

## 2021-06-15 MED ORDER — SUGAMMADEX SODIUM 200 MG/2ML IV SOLN
INTRAVENOUS | Status: DC | PRN
  Administered 2021-06-15: 200 mg via INTRAVENOUS

## 2021-06-15 MED ORDER — SUCCINYLCHOLINE CHLORIDE 200 MG/10ML IV SOSY
PREFILLED_SYRINGE | INTRAVENOUS | Status: AC
  Filled 2021-06-15: qty 10

## 2021-06-15 MED ORDER — ROCURONIUM BROMIDE 100 MG/10ML IV SOLN
INTRAVENOUS | Status: DC | PRN
  Administered 2021-06-15: 50 mg via INTRAVENOUS
  Administered 2021-06-15: 20 mg via INTRAVENOUS

## 2021-06-15 MED ORDER — ONDANSETRON HCL 4 MG/2ML IJ SOLN: 4.0000 mg | Freq: Four times a day (QID) | INTRAMUSCULAR | Status: DC | PRN

## 2021-06-15 MED ORDER — DIPHENHYDRAMINE HCL 50 MG/ML IJ SOLN: 25.0000 mg | Freq: Four times a day (QID) | INTRAMUSCULAR | Status: DC | PRN

## 2021-06-15 MED ORDER — SCOPOLAMINE 1 MG/3DAYS TD PT72
MEDICATED_PATCH | TRANSDERMAL | Status: AC
  Filled 2021-06-15: qty 1

## 2021-06-15 MED ORDER — DIPHENHYDRAMINE HCL 50 MG/ML IJ SOLN
INTRAMUSCULAR | Status: DC | PRN
  Administered 2021-06-15: 12.5 mg via INTRAVENOUS

## 2021-06-15 MED ORDER — EPHEDRINE 5 MG/ML INJ
INTRAVENOUS | Status: AC
  Filled 2021-06-15: qty 10

## 2021-06-15 MED ORDER — NITROGLYCERIN 2 % TD OINT
TOPICAL_OINTMENT | TRANSDERMAL | Status: AC
  Filled 2021-06-15: qty 30

## 2021-06-15 MED ORDER — LACTATED RINGERS IV SOLN: INTRAVENOUS | Status: DC

## 2021-06-15 MED ORDER — SODIUM CHLORIDE (PF) 0.9 % IJ SOLN
INTRAMUSCULAR | Status: AC
  Filled 2021-06-15: qty 10

## 2021-06-15 SURGICAL SUPPLY — 100 items
ADH SKN CLS APL DERMABOND .7 (GAUZE/BANDAGES/DRESSINGS) ×2
APL PRP STRL LF DISP 70% ISPRP (MISCELLANEOUS) ×2
APL SKNCLS STERI-STRIP NONHPOA (GAUZE/BANDAGES/DRESSINGS) ×2
APPLIER CLIP 9.375 MED OPEN (MISCELLANEOUS) ×2
APR CLP MED 9.3 20 MLT OPN (MISCELLANEOUS) ×1
BAG DECANTER FOR FLEXI CONT (MISCELLANEOUS) ×2 IMPLANT
BENZOIN TINCTURE PRP APPL 2/3 (GAUZE/BANDAGES/DRESSINGS) ×2 IMPLANT
BINDER BREAST XLRG (GAUZE/BANDAGES/DRESSINGS) ×1 IMPLANT
BIOPATCH RED 1 DISK 7.0 (GAUZE/BANDAGES/DRESSINGS) ×2 IMPLANT
BLADE CLIPPER SURG (BLADE) IMPLANT
BLADE SURG 10 STRL SS (BLADE) ×3 IMPLANT
BLADE SURG 15 STRL LF DISP TIS (BLADE) ×2 IMPLANT
BLADE SURG 15 STRL SS (BLADE) ×4
BNDG CMPR MED 10X6 ELC LF (GAUZE/BANDAGES/DRESSINGS)
BNDG ELASTIC 6X10 VLCR STRL LF (GAUZE/BANDAGES/DRESSINGS) ×1 IMPLANT
CANISTER SUCT 1200ML W/VALVE (MISCELLANEOUS) ×2 IMPLANT
CHLORAPREP W/TINT 26 (MISCELLANEOUS) ×4 IMPLANT
CLIP APPLIE 9.375 MED OPEN (MISCELLANEOUS) ×1 IMPLANT
COVER BACK TABLE 60X90IN (DRAPES) ×2 IMPLANT
COVER MAYO STAND STRL (DRAPES) ×2 IMPLANT
DECANTER SPIKE VIAL GLASS SM (MISCELLANEOUS) IMPLANT
DERMABOND ADVANCED (GAUZE/BANDAGES/DRESSINGS) ×2
DERMABOND ADVANCED .7 DNX12 (GAUZE/BANDAGES/DRESSINGS) ×1 IMPLANT
DRAIN CHANNEL 15F RND FF W/TCR (WOUND CARE) ×3 IMPLANT
DRAIN CHANNEL 19F RND (DRAIN) ×1 IMPLANT
DRAPE LAPAROSCOPIC ABDOMINAL (DRAPES) ×2 IMPLANT
DRAPE TOP ARMCOVERS (MISCELLANEOUS) ×2 IMPLANT
DRAPE UTILITY XL STRL (DRAPES) ×3 IMPLANT
DRESSING MEPILEX FLEX 4X4 (GAUZE/BANDAGES/DRESSINGS) IMPLANT
DRSG MEPILEX FLEX 4X4 (GAUZE/BANDAGES/DRESSINGS) ×4
DRSG PAD ABDOMINAL 8X10 ST (GAUZE/BANDAGES/DRESSINGS) ×4 IMPLANT
DRSG TEGADERM 2-3/8X2-3/4 SM (GAUZE/BANDAGES/DRESSINGS) ×2 IMPLANT
DRSG TEGADERM 4X10 (GAUZE/BANDAGES/DRESSINGS) ×1 IMPLANT
ELECT BLADE 4.0 EZ CLEAN MEGAD (MISCELLANEOUS) ×2
ELECT COATED BLADE 2.86 ST (ELECTRODE) IMPLANT
ELECT REM PT RETURN 9FT ADLT (ELECTROSURGICAL) ×2
ELECTRODE BLDE 4.0 EZ CLN MEGD (MISCELLANEOUS) IMPLANT
ELECTRODE REM PT RTRN 9FT ADLT (ELECTROSURGICAL) ×1 IMPLANT
EVACUATOR SILICONE 100CC (DRAIN) ×3 IMPLANT
FUNNEL KELLER 2 DISP (MISCELLANEOUS) IMPLANT
GAUZE SPONGE 4X4 12PLY STRL (GAUZE/BANDAGES/DRESSINGS) ×2 IMPLANT
GAUZE SPONGE 4X4 12PLY STRL LF (GAUZE/BANDAGES/DRESSINGS) ×4 IMPLANT
GLOVE SRG 8 PF TXTR STRL LF DI (GLOVE) IMPLANT
GLOVE SURG ENC MOIS LTX SZ7 (GLOVE) ×2 IMPLANT
GLOVE SURG ENC TEXT LTX SZ7.5 (GLOVE) ×4 IMPLANT
GLOVE SURG POLYISO LF SZ6.5 (GLOVE) ×1 IMPLANT
GLOVE SURG POLYISO LF SZ7 (GLOVE) ×2 IMPLANT
GLOVE SURG SIGNA 7.5 PF LTX (GLOVE) ×2 IMPLANT
GLOVE SURG UNDER POLY LF SZ6.5 (GLOVE) ×1 IMPLANT
GLOVE SURG UNDER POLY LF SZ7 (GLOVE) ×3 IMPLANT
GLOVE SURG UNDER POLY LF SZ8 (GLOVE) ×2
GOWN STRL REUS W/ TWL LRG LVL3 (GOWN DISPOSABLE) ×3 IMPLANT
GOWN STRL REUS W/ TWL XL LVL3 (GOWN DISPOSABLE) ×1 IMPLANT
GOWN STRL REUS W/TWL LRG LVL3 (GOWN DISPOSABLE) ×8
GOWN STRL REUS W/TWL XL LVL3 (GOWN DISPOSABLE) ×2
GRAFT FLEX HD 19X22X0.7-1.4 (Tissue) ×2 IMPLANT
HEMOSTAT SURGICEL 2X14 (HEMOSTASIS) IMPLANT
IMPL EXPANDER BREAST 535CC (Breast) IMPLANT
IMPLANT BREAST 535CC (Breast) ×2 IMPLANT
IMPLANT EXPANDER BREAST 535CC (Breast) ×2 IMPLANT
IV NS 500ML (IV SOLUTION)
IV NS 500ML BAXH (IV SOLUTION) IMPLANT
KIT FILL SYSTEM UNIVERSAL (SET/KITS/TRAYS/PACK) ×1 IMPLANT
LIGHT WAVEGUIDE WIDE FLAT (MISCELLANEOUS) ×1 IMPLANT
MARKER SKIN DUAL TIP RULER LAB (MISCELLANEOUS) IMPLANT
NDL HYPO 25X1 1.5 SAFETY (NEEDLE) IMPLANT
NEEDLE HYPO 25X1 1.5 SAFETY (NEEDLE) ×2 IMPLANT
NS IRRIG 1000ML POUR BTL (IV SOLUTION) ×2 IMPLANT
PACK BASIN DAY SURGERY FS (CUSTOM PROCEDURE TRAY) ×2 IMPLANT
PACK SPY-PHI (KITS) ×2 IMPLANT
PENCIL SMOKE EVACUATOR (MISCELLANEOUS) ×2 IMPLANT
PIN SAFETY STERILE (MISCELLANEOUS) ×2 IMPLANT
RETRACTOR ONETRAX LX 135X30 (MISCELLANEOUS) IMPLANT
SHEET MEDIUM DRAPE 40X70 STRL (DRAPES) ×2 IMPLANT
SLEEVE SCD COMPRESS KNEE MED (STOCKING) ×2 IMPLANT
SPONGE T-LAP 18X18 ~~LOC~~+RFID (SPONGE) ×10 IMPLANT
STAPLER VISISTAT 35W (STAPLE) IMPLANT
STRIP CLOSURE SKIN 1/2X4 (GAUZE/BANDAGES/DRESSINGS) ×4 IMPLANT
SUT ETHILON 2 0 FS 18 (SUTURE) IMPLANT
SUT ETHILON 3 0 PS 1 (SUTURE) ×3 IMPLANT
SUT MNCRL AB 4-0 PS2 18 (SUTURE) ×4 IMPLANT
SUT MON AB 3-0 SH 27 (SUTURE)
SUT MON AB 3-0 SH27 (SUTURE) IMPLANT
SUT MON AB 4-0 PC3 18 (SUTURE) ×1 IMPLANT
SUT PDS 3-0 CT2 (SUTURE) ×12
SUT PDS II 3-0 CT2 27 ABS (SUTURE) ×1 IMPLANT
SUT PLAIN 5 0 P 3 18 (SUTURE) IMPLANT
SUT VIC AB 3-0 SH 27 (SUTURE) ×4
SUT VIC AB 3-0 SH 27X BRD (SUTURE) ×2 IMPLANT
SUT VLOC 180 0 24IN GS25 (SUTURE) IMPLANT
SUT VLOC 90 P-14 23 (SUTURE) ×2 IMPLANT
SYR BULB EAR ULCER 3OZ GRN STR (SYRINGE) ×1 IMPLANT
SYR BULB IRRIG 60ML STRL (SYRINGE) ×2 IMPLANT
SYR CONTROL 10ML LL (SYRINGE) ×1 IMPLANT
TAPE MEASURE VINYL STERILE (MISCELLANEOUS) IMPLANT
TOWEL GREEN STERILE FF (TOWEL DISPOSABLE) ×4 IMPLANT
TRAY FOLEY W/BAG SLVR 14FR LF (SET/KITS/TRAYS/PACK) IMPLANT
TUBE CONNECTING 20X1/4 (TUBING) ×2 IMPLANT
UNDERPAD 30X36 HEAVY ABSORB (UNDERPADS AND DIAPERS) ×2 IMPLANT
YANKAUER SUCT BULB TIP NO VENT (SUCTIONS) ×2 IMPLANT

## 2021-06-15 NOTE — Anesthesia Preprocedure Evaluation (Addendum)
Anesthesia Evaluation  Patient identified by MRN, date of birth, ID band Patient awake    Reviewed: Allergy & Precautions, NPO status , Patient's Chart, lab work & pertinent test results  History of Anesthesia Complications (+) PONV  Airway Mallampati: II  TM Distance: >3 FB Neck ROM: Full    Dental  (+) Teeth Intact   Pulmonary asthma ,    Pulmonary exam normal        Cardiovascular hypertension, Pt. on medications  Rhythm:Regular Rate:Normal     Neuro/Psych  Headaches, Anxiety Depression CVA (2003)    GI/Hepatic negative GI ROS, Neg liver ROS,   Endo/Other  negative endocrine ROS  Renal/GU negative Renal ROS  negative genitourinary   Musculoskeletal Bilateral high risk breast lesions   Abdominal (+)  Abdomen: soft. Bowel sounds: normal.  Peds  Hematology negative hematology ROS (+)   Anesthesia Other Findings   Reproductive/Obstetrics                             Anesthesia Physical Anesthesia Plan  ASA: 3  Anesthesia Plan: General   Post-op Pain Management:    Induction: Intravenous  PONV Risk Score and Plan: 4 or greater and Ondansetron, Aprepitant, Dexamethasone, Midazolam, Scopolamine patch - Pre-op, Treatment may vary due to age or medical condition and Amisulpride  Airway Management Planned: Mask and Oral ETT  Additional Equipment: None  Intra-op Plan:   Post-operative Plan: Extubation in OR  Informed Consent: I have reviewed the patients History and Physical, chart, labs and discussed the procedure including the risks, benefits and alternatives for the proposed anesthesia with the patient or authorized representative who has indicated his/her understanding and acceptance.     Dental advisory given  Plan Discussed with: CRNA  Anesthesia Plan Comments: (  Lab Results      Component                Value               Date                      NA                        136                 06/11/2021                K                        4.3                 06/11/2021                CO2                      25                  06/11/2021                GLUCOSE                  107 (H)             06/11/2021                BUN  12                  06/11/2021                CREATININE               0.51                06/11/2021                CALCIUM                  8.7 (L)             06/11/2021                GFRNONAA                 >60                 06/11/2021                GFRAA                    >60                 05/25/2016          )       Anesthesia Quick Evaluation

## 2021-06-15 NOTE — Anesthesia Procedure Notes (Addendum)
Procedure Name: Intubation Date/Time: 06/15/2021 9:40 AM Performed by: Willa Frater, CRNA Pre-anesthesia Checklist: Patient identified, Emergency Drugs available, Suction available and Patient being monitored Patient Re-evaluated:Patient Re-evaluated prior to induction Oxygen Delivery Method: Circle system utilized Preoxygenation: Pre-oxygenation with 100% oxygen Induction Type: IV induction Ventilation: Mask ventilation without difficulty Laryngoscope Size: Mac and 3 Grade View: Grade I Tube type: Oral Tube size: 7.0 mm Number of attempts: 1 Airway Equipment and Method: Stylet and Oral airway Placement Confirmation: ETT inserted through vocal cords under direct vision, positive ETCO2 and breath sounds checked- equal and bilateral Secured at: 23 cm Tube secured with: Tape Dental Injury: Teeth and Oropharynx as per pre-operative assessment

## 2021-06-15 NOTE — Brief Op Note (Signed)
06/15/2021  12:40 PM  PATIENT:  Wendy Simpson  45 y.o. female  PRE-OPERATIVE DIAGNOSIS:  BILATERAL HIGH RISK BREAST LESIONS, HIGH RISK OF LIFETIME BREAST CANCER  POST-OPERATIVE DIAGNOSIS:  BILATERAL HIGH RISK BREAST LESIONS, HIGH RISK OF LIFETIME BREAST CANCER  PROCEDURE:  Procedure(s): BILATERAL NIPPLE SPARING MASTECTOMY (Bilateral) BILATERAL BREAST RECONSTRUCTION WITH PLACEMENT OF TISSUE EXPANDERS AND FLEX HD (ACELLULAR HYDRATED DERMIS) (Bilateral)  SURGEON:  Surgeon(s) and Role: Panel 1:    Coralie Keens, MD - Primary Panel 2:    * [name], MD - Primary  PHYSICIAN ASSISTANT: Software engineer, PA  ASSISTANTS: none   ANESTHESIA:   general  EBL:  45 mL   BLOOD ADMINISTERED:none  DRAINS: (2) Jackson-Pratt drain(s) with closed bulb suction in the chest    LOCAL MEDICATIONS USED:  BUPIVICAINE   SPECIMEN:  Source of Specimen:  bilateral mastectomy flap skin  DISPOSITION OF SPECIMEN:  PATHOLOGY  COUNTS:  YES  TOURNIQUET:  * No tourniquets in log *  DICTATION: .Dragon Dictation  PLAN OF CARE: Admit for Obs  PATIENT DISPOSITION:  PACU - hemodynamically stable.   Delay start of Pharmacological VTE agent (>24hrs) due to surgical blood loss or risk of bleeding: not applicable

## 2021-06-15 NOTE — Transfer of Care (Signed)
Immediate Anesthesia Transfer of Care Note  Patient: Wendy Simpson  Procedure(s) Performed: BILATERAL NIPPLE SPARING MASTECTOMY (Bilateral: Breast) BILATERAL BREAST RECONSTRUCTION WITH PLACEMENT OF TISSUE EXPANDERS AND FLEX HD (ACELLULAR HYDRATED DERMIS) (Bilateral: Breast)  Patient Location: PACU  Anesthesia Type:General  Level of Consciousness: awake, alert , oriented, drowsy and patient cooperative  Airway & Oxygen Therapy: Patient Spontanous Breathing and Patient connected to face mask oxygen  Post-op Assessment: Report given to RN and Post -op Vital signs reviewed and stable  Post vital signs: Reviewed and stable  Last Vitals:  Vitals Value Taken Time  BP    Temp    Pulse    Resp    SpO2      Last Pain:  Vitals:   06/15/21 0824  TempSrc: Oral  PainSc: 0-No pain         Complications: No notable events documented.

## 2021-06-15 NOTE — Discharge Instructions (Addendum)
Activity As tolerated: NO showers for 3 days. Keep breast binder on breasts until then. After showering, be sure to continue wearing breast binder. NO driving while in pain, taking pain medication or if you are unable to safely react to traffic. No heavy lifting/strenuous activities.  Take Pain medication (Norco) as needed for severe pain. Otherwise, you can use ibuprofen or tylenol as needed. Avoid more than 3,000 mg of tylenol in 24 hours. Norco has '325mg'$  of tylenol per dose.   Diet: Regular. Drink plenty of fluids and eat healthy, high protein, low carbs.  Wound Care: Keep dressing clean & dry. You may change bandages after showering if you continue to notice some drainage. You can reuse bandages if they are not dirty/soiled.  Continue to monitor drain output daily or multiple times per day and record.   Rx for Norco (pain medicine), Bactrim (antibiotic) and Zofran (nausea) were sent to your pharmacy. Apply Nitropaste to left breast 2x daily (1 inch every 12 hours)  Special Instructions: Call Doctor if any unusual problems occur such as pain, excessive Bleeding, unrelieved Nausea/vomiting, Fever &/or chills  Follow-up appointment: Scheduled for next week.  About my Jackson-Pratt Bulb Drain  What is a Jackson-Pratt bulb? A Jackson-Pratt is a soft, round device used to collect drainage. It is connected to a long, thin drainage catheter, which is held in place by one or two small stiches near your surgical incision site. When the bulb is squeezed, it forms a vacuum, forcing the drainage to empty into the bulb.  Emptying the Jackson-Pratt bulb- To empty the bulb: 1. Release the plug on the top of the bulb. 2. Pour the bulb's contents into a measuring container which your nurse will provide. 3. Record the time emptied and amount of drainage. Empty the drain(s) as often as your     doctor or nurse recommends.  Date                  Time                    Amount (Drain 1)                  Amount (Drain 2)  _____________________________________________________________________________________  _____________________________________________________________________________________  _____________________________________________________________________________________  _____________________________________________________________________________________  _____________________________________________________________________________________  _____________________________________________________________________________________  _____________________________________________________________________________________  _____________________________________________________________________________________  Squeezing the Jackson-Pratt Bulb- To squeeze the bulb: 1. Make sure the plug at the top of the bulb is open. 2. Squeeze the bulb tightly in your fist. You will hear air squeezing from the bulb. 3. Replace the plug while the bulb is squeezed. 4. Use a safety pin to attach the bulb to your clothing. This will keep the catheter from     pulling at the bulb insertion site.  When to call your doctor- Call your doctor if: Drain site becomes red, swollen or hot. You have a fever greater than 101 degrees F. There is oozing at the drain site. Drain falls out (apply a guaze bandage over the drain hole and secure it with tape). Drainage increases daily not related to activity patterns. (You will usually have more drainage when you are active than when you are resting.) Drainage has a bad odor.    Information for Discharge Teaching: EXPAREL (bupivacaine liposome injectable suspension)   Your surgeon or anesthesiologist gave you EXPAREL(bupivacaine) to help control your pain after surgery.  EXPAREL is a local anesthetic that provides pain relief by numbing the tissue around the surgical site. EXPAREL is designed to release  pain medication over time and can control pain for up to 72  hours. Depending on how you respond to EXPAREL, you may require less pain medication during your recovery.  Possible side effects: Temporary loss of sensation or ability to move in the area where bupivacaine was injected. Nausea, vomiting, constipation Rarely, numbness and tingling in your mouth or lips, lightheadedness, or anxiety may occur. Call your doctor right away if you think you may be experiencing any of these sensations, or if you have other questions regarding possible side effects.  Follow all other discharge instructions given to you by your surgeon or nurse. Eat a healthy diet and drink plenty of water or other fluids.  If you return to the hospital for any reason within 96 hours following the administration of EXPAREL, it is important for health care providers to know that you have received this anesthetic. A teal colored band has been placed on your arm with the date, time and amount of EXPAREL you have received in order to alert and inform your health care providers. Please leave this armband in place for the full 96 hours following administration, and then you may remove the band.

## 2021-06-15 NOTE — Anesthesia Postprocedure Evaluation (Signed)
Anesthesia Post Note  Patient: Wendy Simpson  Procedure(s) Performed: BILATERAL NIPPLE SPARING MASTECTOMY (Bilateral: Breast) BILATERAL BREAST RECONSTRUCTION WITH PLACEMENT OF TISSUE EXPANDERS AND FLEX HD (ACELLULAR HYDRATED DERMIS) (Bilateral: Breast)     Patient location during evaluation: PACU Anesthesia Type: General Level of consciousness: awake and alert Pain management: pain level controlled Vital Signs Assessment: post-procedure vital signs reviewed and stable Respiratory status: spontaneous breathing, nonlabored ventilation, respiratory function stable and patient connected to nasal cannula oxygen Cardiovascular status: blood pressure returned to baseline and stable Postop Assessment: no apparent nausea or vomiting Anesthetic complications: no   No notable events documented.  Last Vitals:  Vitals:   06/15/21 1345 06/15/21 1420  BP: 132/78 125/66  Pulse: (!) 101 (!) 103  Resp: 19 18  Temp:  36.8 C  SpO2: 98% 97%    Last Pain:  Vitals:   06/15/21 1420  TempSrc:   PainSc: 5                  Areal Cochrane P Asna Muldrow

## 2021-06-15 NOTE — Op Note (Signed)
BILATERAL NIPPLE SPARING MASTECTOMY  Procedure Note  Wendy Simpson 06/15/2021   Pre-op Diagnosis: BILATERAL HIGH RISK BREAST LESIONS, HIGH RISK OF LIFETIME BREAST CANCER     Post-op Diagnosis: same  Procedure(s): BILATERAL NIPPLE SPARING MASTECTOMY   Surgeons: Coralie Keens, MD                   Zacarias Pontes, MD  Anesthesia: General  Staff:  Circulator: Ted Mcalpine, RN Scrub Person: Murvin Natal Vendor Representative : Pollyann Savoy Circulator Assistant: Izora Ribas, RN  Estimated Blood Loss: less than 100 mL               Specimens: sent to path  Indications: This is a 45 year old female who presents with multiple lesions of both her breast.  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  There were 2 small areas identified in the left breast on MRI.  She is elected to forego biopsy of these and wants to proceed with nipple sparing bilateral mastectomies  Findings: Frozen section at the nipple of both breasts was negative for malignancy  Procedure: The patient was brought to operating identifies correct patient.  She is placed upon the operating table general anesthesia was induced.  Her breast were prepped and draped in usual sterile fashion.  I made a transverse incision at the inframammary fold on the right breast with a scalpel.  I then dissected down to the underlying muscle through the breast tissue.  I then started creating the superior skin flap with the cautery.  Then with the aid of retractors I stayed just underneath the skin working toward the nipple areolar complex as well as medial and lateral with the cautery.  Once we reached the nipple areolar complex we took a biopsy with a scalpel of tissue right at the nipple which is sent to pathology.  Frozen section confirmed benign tissue with no evidence of malignancy.  We then continued dissection with the cautery working superiorly  past the nipple and over the level of the clavicle.  Meticulous dissection around the skin flaps medial and lateral as well.  I then took the dissection down to the chest wall.  Next the breast tissue was dissected off of the chest wall working medial laterally with electrocautery.  Once the specimen was removed I marked the lateral edge with a suture.  I did take further tissue in the upper outer quadrant and laterally in the breast at the skin flap.  We then irrigated the wound with saline.  Hemostasis appeared to be achieved.  The flap and nipple appeared viable. I then made a similar incision transversely the inframammary ridge of the left breast.  We then again dissected down to the chest wall.  We then created a superior flap again staying underneath the skin and dermis dissecting superiorly toward the nipple areolar complex.  The dissection was carried medial to lateral with the cautery.  Again the nipple was identified and a small biopsy was taken with a scalpel of the tissue underneath the nipple.  Again this was sent to pathology and no evidence of malignancy was identified.   The dissection was then continued superiorly working toward the level of the clavicle.  I then took the breast tissue down toward the level of the chest wall with the cautery working and medial lateral.  We then dissected the breast tissue off of the chest wall with electrocautery again dissecting medial  to lateral reaching the level of the axilla.  We then completed the mastectomy removing the tissue with cautery.  I again marked to the lateral margin with a suture.  We irrigated the cavity with saline.  Hemostasis appeared to be achieved.  At this point Dr. Claudia Desanctis was already in the operating room and then continued his portion of the reconstruction.  At this point all counts were correct at the end of the procedure.          Coralie Keens   Date: 06/15/2021  Time: 11:28 AM

## 2021-06-15 NOTE — Op Note (Addendum)
Operative Note   DATE OF OPERATION: 06/15/2021  SURGICAL DEPARTMENT: Plastic Surgery  PREOPERATIVE DIAGNOSES: Bilateral mastectomy defects  POSTOPERATIVE DIAGNOSES:  same  PROCEDURE: 1.  Bilateral breast reconstruction with tissue expanders and acellular dermal matrix 2.  Indocyanine green angiography bilateral mastectomy flaps  SURGEON: Talmadge Coventry, MD  ASSISTANT: Verdie Shire, PA The advanced practice practitioner (APP) assisted throughout the case.  The APP was essential in retraction and counter traction when needed to make the case progress smoothly.  This retraction and assistance made it possible to see the tissue plans for the procedure.  The assistance was needed for blood control, tissue re-approximation and assisted with closure of the incision site.  ANESTHESIA:  General.   COMPLICATIONS: None.   INDICATIONS FOR PROCEDURE:  The patient, Wendy Simpson is a 45 y.o. female born on 09/20/1976, is here for treatment of bilateral mastectomy defects MRN: [id number]  CONSENT:  Informed consent was obtained directly from the patient. Risks, benefits and alternatives were fully discussed. Specific risks including but not limited to bleeding, infection, hematoma, seroma, scarring, pain, contracture, asymmetry, wound healing problems, and need for further surgery were all discussed. The patient did have an ample opportunity to have questions answered to satisfaction.   DESCRIPTION OF PROCEDURE:  The patient was taken to the operating room. SCDs were placed and antibiotics were given.  General anesthesia was administered.  The patient's operative site was prepped and draped in a sterile fashion. A time out was performed and all information was confirmed to be correct.  Dr. Ninfa Linden performed bilateral nipple sparing mastectomies through inframammary incisions.  His part will be dictated separately.  I then assumed care of the patient.  I inspected the mastectomy flaps and  clinically they look to be in good condition.  There is a few thin areas on the left side.  I then performed indocyanine green angiography to examine the perfusion.  The right side looks great with the exception of a thin area of delayed fill right along the incision line.  The left side had a few dark areas inferior to the nipple areolar complex and medial to the nipple areolar complex.  The skin approaching the incision looks to be perfused.  I did elect to take a few millimeters of skin along the most inferior portion of the skin flap on both sides.  This was done with a 15 blade and the excised skin was sent as a separate specimen.  20 cc of Exparel was diluted in 30 cc of quarter percent Marcaine and this was divided between the 2 sides for local anesthetic.  I then achieved meticulous hemostasis in both pockets.  0 V-Loc was used to tack down the lateral soft tissues to close off the lateral aspect of the pocket.  15 French drains were placed on both sides and secured with 3-0 nylon sutures.  Both pockets were irrigated with triple antibiotic solution.  I then brought the Flex HD pieces onto the field.  These were both Flex HD pliable pre-Peace's which were soaked in saline.  Expanders were then brought onto the field.  I used a Neurosurgeon plus smooth ultrahigh profile expander with a fill volume of 535 cc.  Serial number on the left side is [id number].  Serial number on the right side is [id number] all the air was removed from the expanders.  A 3-0 PDS pursestring was run around the periphery of the Flex HD.  The expander was then inserted into the matrix and  the pursestring tied down.  Suture tabs were brought out at the matrix at 3, 6, and 9:00.  I then placed 3-0 PDS sutures in the chest wall corresponding to 3, 6, and 9:00.  I ensured again meticulous hemostasis and ran the PDS stay sutures through the suture tabs and inserted the implant into the pocket and tied them down.  150 cc of air was then  put in both expanders which put no tension on the skin.  Skin was then closed with Enzor stapler and 3 oh V-Loc.  I did perform indocyanine green angiography a second time at the end of the case and still noted some filling deficiencies on the left side inferior and medial to the nipple areolar complex.  I elected to place Nitropaste over this area and will continue to do so postoperatively.  Steri-Strips were then placed along the incisions and a soft dressing was applied.  The patient tolerated the procedure well.  There were no complications. The patient was allowed to wake from anesthesia, extubated and taken to the recovery room in satisfactory condition.

## 2021-06-15 NOTE — Interval H&P Note (Signed)
History and Physical Interval Note:no change in H and P  06/15/2021 8:39 AM  Wendy Simpson  has presented today for surgery, with the diagnosis of BILATERAL HIGH RISK BREAST LEISURES, HIGH RISK OF LIFETIME BREAST CANCER.  The various methods of treatment have been discussed with the patient and family. After consideration of risks, benefits and other options for treatment, the patient has consented to  Procedure(s): BILATERAL NIPPLE SPARING MASTECTOMY (Bilateral) BREAST RECONSTRUCTION WITH PLACEMENT OF TISSUE EXPANDER AND FLEX HD (ACELLULAR HYDRATED DERMIS) (Bilateral) as a surgical intervention.  The patient's history has been reviewed, patient examined, no change in status, stable for surgery.  I have reviewed the patient's chart and labs.  Questions were answered to the patient's satisfaction.     Coralie Keens

## 2021-06-15 NOTE — Interval H&P Note (Signed)
History and Physical Interval Note:  06/15/2021 9:30 AM  Wendy Simpson Alexie  has presented today for surgery, with the diagnosis of BILATERAL HIGH RISK BREAST LEISURES, HIGH RISK OF LIFETIME BREAST CANCER.  The various methods of treatment have been discussed with the patient and family. After consideration of risks, benefits and other options for treatment, the patient has consented to  Procedure(s): BILATERAL NIPPLE SPARING MASTECTOMY (Bilateral) BREAST RECONSTRUCTION WITH PLACEMENT OF TISSUE EXPANDER AND FLEX HD (ACELLULAR HYDRATED DERMIS) (Bilateral) as a surgical intervention.  The patient's history has been reviewed, patient examined, no change in status, stable for surgery.  I have reviewed the patient's chart and labs.  Questions were answered to the patient's satisfaction.     [name]

## 2021-06-16 ENCOUNTER — Other Ambulatory Visit: Payer: Self-pay | Admitting: Surgical

## 2021-06-16 ENCOUNTER — Encounter (HOSPITAL_BASED_OUTPATIENT_CLINIC_OR_DEPARTMENT_OTHER): Payer: Self-pay | Admitting: Surgery

## 2021-06-16 DIAGNOSIS — N6489 Other specified disorders of breast: Secondary | ICD-10-CM | POA: Diagnosis not present

## 2021-06-16 DIAGNOSIS — N6021 Fibroadenosis of right breast: Secondary | ICD-10-CM | POA: Diagnosis not present

## 2021-06-16 DIAGNOSIS — D0501 Lobular carcinoma in situ of right breast: Secondary | ICD-10-CM | POA: Diagnosis not present

## 2021-06-16 DIAGNOSIS — Z803 Family history of malignant neoplasm of breast: Secondary | ICD-10-CM | POA: Diagnosis not present

## 2021-06-16 DIAGNOSIS — D242 Benign neoplasm of left breast: Secondary | ICD-10-CM | POA: Diagnosis not present

## 2021-06-16 MED ORDER — SULFAMETHOXAZOLE-TRIMETHOPRIM 800-160 MG PO TABS: 1.0000 | ORAL_TABLET | Freq: Two times a day (BID) | ORAL | 0 refills | Status: AC

## 2021-06-16 MED ORDER — ONDANSETRON HCL 4 MG PO TABS: 4.0000 mg | ORAL_TABLET | Freq: Three times a day (TID) | ORAL | 0 refills | Status: DC | PRN

## 2021-06-16 MED ORDER — HYDROCODONE-ACETAMINOPHEN 5-325 MG PO TABS: 1.0000 | ORAL_TABLET | Freq: Four times a day (QID) | ORAL | 0 refills | Status: AC | PRN

## 2021-06-16 NOTE — Progress Notes (Signed)
Post op meds 

## 2021-06-16 NOTE — Discharge Summary (Signed)
Physician Discharge Summary  Patient ID: Wendy Simpson MRN: [id number] DOB/AGE: April 12, 1976 45 y.o.  Admit date: 06/15/2021 Discharge date: 06/16/2021  Admission Diagnoses:  Discharge Diagnoses:  Active Problems:   Breast cancer [name])   Discharged Condition: good  Hospital Course: 45 year old female who presented to the [facility] surgical center for bilateral nipple sparing mastectomy by Dr. Ninfa Linden followed by bilateral breast reconstruction with placement of tissue expanders and Flex HD by Dr. Claudia Desanctis on 06/15/2021.  Patient stayed overnight for observation and pain control.  Patient is doing well this morning.  She had some mild discomfort last night, well controlled with Norco.  She is not having any fevers or chills this morning.  She overall feels well.  Her husband is at bedside, she is ready to go home.  Consults: None  Significant Diagnostic Studies: None  Treatments: analgesia: Vicodin  Discharge Exam: Blood pressure 138/77, pulse 65, temperature 98.4 F (36.9 C), resp. rate 18, height '5\' 4"'$  (1.626 m), weight 80.5 kg, last menstrual period 05/24/2021, SpO2 95 %. General appearance: alert, cooperative, no distress, and resting in bed, family at bedside Resp: Unlabored Breasts: Status post nipple sparing mastectomy, Steri-Strips in place over incisions.  No subcutaneous fluid collections noted with palpation.  Bilateral JP drains in place with serosanguineous drainage noted.  No skin necrosis is noted.  The left breast has some Nitropaste in place just inferior medial to the nipple areola.  The skin overall appears to be doing well, I do not see any significant concerns for vascular compromise.  Overall appears to have good cap refill in bilateral breasts flaps.  Incisions appear intact, no bruising or hematoma noted. Extremities: extremities normal, atraumatic, no cyanosis or edema   Disposition: Discharge disposition: 01-Home or Self Care       Discharge Instructions      Call MD for:  difficulty breathing, headache or visual disturbances   Complete by: As directed    Call MD for:  extreme fatigue   Complete by: As directed    Call MD for:  hives   Complete by: As directed    Call MD for:  persistant dizziness or light-headedness   Complete by: As directed    Call MD for:  persistant nausea and vomiting   Complete by: As directed    Call MD for:  redness, tenderness, or signs of infection (pain, swelling, redness, odor or green/yellow discharge around incision site)   Complete by: As directed    Call MD for:  severe uncontrolled pain   Complete by: As directed    Call MD for:  temperature >100.4   Complete by: As directed    Diet - low sodium heart healthy   Complete by: As directed    Increase activity slowly   Complete by: As directed       Allergies as of 06/16/2021       Reactions   Codeine    REACTION: Rash        Medication List     TAKE these medications    losartan-hydrochlorothiazide 50-12.5 MG tablet Commonly known as: HYZAAR Take 1 tablet by mouth daily.         [facility] [address] [place], [place] [address] [phone]  Signed: Carola Rhine Isaac Lacson 06/16/2021, 7:59 AM

## 2021-06-17 ENCOUNTER — Telehealth: Payer: Self-pay | Admitting: Plastic Surgery

## 2021-06-17 LAB — SURGICAL PATHOLOGY

## 2021-06-17 NOTE — Telephone Encounter (Signed)
Spoke with patient via phone. See notes.

## 2021-06-17 NOTE — Telephone Encounter (Signed)
Returned patients call. She is having discoloration from the bottom of her areola towards the skin. Denies any fever, chills, nausea or vomiting. Pain level has been very low and has only taking OTC medication. Advised her that I spoke with Dr. Claudia Desanctis. He indicated that having different colors or bruising is common, to continue us/ing the paste BID until her next follow up on 06/23/21 with [facility] Patient understood and agreed with plan.

## 2021-06-17 NOTE — Telephone Encounter (Signed)
Patient is experiencing increased discoloration after reconstructive sx 8/23. No additional pain or swelling reported. Please call to advise [phone]. Thank you.

## 2021-06-23 ENCOUNTER — Other Ambulatory Visit: Payer: Self-pay

## 2021-06-23 ENCOUNTER — Ambulatory Visit (INDEPENDENT_AMBULATORY_CARE_PROVIDER_SITE_OTHER): Payer: BC Managed Care – PPO | Admitting: Plastic Surgery

## 2021-06-23 ENCOUNTER — Encounter: Payer: Self-pay | Admitting: Plastic Surgery

## 2021-06-23 DIAGNOSIS — D0500 Lobular carcinoma in situ of unspecified breast: Secondary | ICD-10-CM

## 2021-06-23 NOTE — Progress Notes (Signed)
Patient presents postop from bilateral breast reconstruction.  She feels like things are going well.  On exam her incisions are intact.  The skin looks to be doing fine.  There is a small dark area inferior to the areola on the left side which was where she has been putting the Nitropaste but this looks to be pretty superficial and I expected to do well.  She is had about 30 cc/day coming out of her drain so we will leave those for another week.  I have asked her to continue avoid strenuous activity and we will see her next week.

## 2021-06-30 ENCOUNTER — Ambulatory Visit (INDEPENDENT_AMBULATORY_CARE_PROVIDER_SITE_OTHER): Payer: BC Managed Care – PPO | Admitting: Plastic Surgery

## 2021-06-30 ENCOUNTER — Encounter: Payer: Self-pay | Admitting: Plastic Surgery

## 2021-06-30 ENCOUNTER — Other Ambulatory Visit: Payer: Self-pay

## 2021-06-30 DIAGNOSIS — D0500 Lobular carcinoma in situ of unspecified breast: Secondary | ICD-10-CM

## 2021-06-30 NOTE — Progress Notes (Signed)
Patient presents 2 weeks postop from bilateral nipple sparing mastectomy and immediate reconstruction.  She feels like things are going well.  Drain output has been very low so they were both removed today.  The skin on her left side that I been watching looks like it will do fine with just a small area of persistent bruising there.  I do not suspect full-thickness necrosis.  We elected to move forward with her fills.  The air from intraoperative was removed from each expander.  They were both filled under sterile conditions with about 250 cc of saline.  She tolerated this fine.  We will see her next week to continue her fills.

## 2021-07-07 ENCOUNTER — Encounter: Payer: Self-pay | Admitting: Plastic Surgery

## 2021-07-07 ENCOUNTER — Other Ambulatory Visit: Payer: Self-pay

## 2021-07-07 ENCOUNTER — Ambulatory Visit (INDEPENDENT_AMBULATORY_CARE_PROVIDER_SITE_OTHER): Payer: BC Managed Care – PPO | Admitting: Surgical

## 2021-07-07 ENCOUNTER — Encounter: Payer: Self-pay | Admitting: Surgical

## 2021-07-07 DIAGNOSIS — D0500 Lobular carcinoma in situ of unspecified breast: Secondary | ICD-10-CM

## 2021-07-07 NOTE — Progress Notes (Signed)
Patient is a 45 year old female here for follow-up on her bilateral nipple sparing mastectomies and immediate breast reconstruction placement of tissue expanders.  She tolerated her last fill well and would like another fill today.    Chaperone present on exam  skin flaps are healing well, tissue is viable, nipple areolar complexes are viable.  There is no signs of infection.  No subcutaneous fluid collection noted palpation.  Incisions are intact and healing well.  We placed injectable saline in the Expander using a sterile technique: Right: 75 cc for a total of 325 / 535 cc Left: 75 cc for a total of 325 / 535 cc  After today's fill, she reports that she feels much more comfortable with the size and feels as if she is nearing her preferred size.  We will have her follow-up with Dr. Claudia Desanctis at her next appointment for potentially 1 more fill and to discuss surgical planning.  All of her questions were answered to her content.  We will provide her with a return to work note for 07/14/2021 with lifting restrictions no greater than 15 pounds until 6 weeks after surgery which would be July 27, 2021.

## 2021-07-22 ENCOUNTER — Other Ambulatory Visit: Payer: Self-pay

## 2021-07-22 ENCOUNTER — Ambulatory Visit (INDEPENDENT_AMBULATORY_CARE_PROVIDER_SITE_OTHER): Payer: BC Managed Care – PPO | Admitting: Physician Assistant

## 2021-07-22 DIAGNOSIS — Z9889 Other specified postprocedural states: Secondary | ICD-10-CM

## 2021-07-22 NOTE — Progress Notes (Signed)
Patient is a 45 year old female with PMH of LCIS right breast s/p bilateral NSM and immediate breast reconstruction with tissue expanders who presents to clinic for expander fill.  Patient was last seen here in clinic on 07/07/2021 for expander fill.  She has a total of 325 / 535 cc bilaterally.  She states that the procedure was well-tolerated and she would like to proceed with additional fill here today. She denies any increasing pain or tightness, discomfort, fevers or chills, redness, asymmetric swelling, or any other symptoms. Since she anticipates that this is the last one, she was hoping that she could have a slightly larger fill than previous.  Discussed 70 cc each side, she is agreeable.  On physical exam, breasts are symmetric in size and shape.  NAC's are viable.  No redness or areas concerning for subcutaneous fluid collection.    We placed injectable saline in the Expander using a sterile technique: Right: 70 cc for a total of 395 / 535 cc Left: 70 cc for a total of 395 / 535 cc  Given that she would like to proceed with next phase of reconstruction, will obtain photos.  Chaperone present for exam.  She discussed what to expect with Dr. Claudia Desanctis.  She would like to work for the next 4 to 5 weeks, but is then willing to proceed with implant exchange, preferably early to mid November.  Any pictures obtained of the patient and placed in the chart were with the patient's or guardian's permission.

## 2021-08-03 DIAGNOSIS — Z23 Encounter for immunization: Secondary | ICD-10-CM | POA: Diagnosis not present

## 2021-08-08 ENCOUNTER — Telehealth: Payer: BC Managed Care – PPO | Admitting: Nurse Practitioner

## 2021-08-08 DIAGNOSIS — I10 Essential (primary) hypertension: Secondary | ICD-10-CM

## 2021-08-08 DIAGNOSIS — Z760 Encounter for issue of repeat prescription: Secondary | ICD-10-CM | POA: Diagnosis not present

## 2021-08-08 MED ORDER — LOSARTAN POTASSIUM-HCTZ 50-12.5 MG PO TABS: 1.0000 | ORAL_TABLET | Freq: Every day | ORAL | 0 refills | Status: DC

## 2021-08-08 NOTE — Patient Instructions (Signed)
  Drucie Opitz Cumming, thank you for joining Gildardo Pounds, NP for today's virtual visit.  While this provider is not your primary care provider (PCP), if your PCP is located in our provider database this encounter information will be shared with them immediately following your visit.  Consent: (Patient) Wendy Simpson provided verbal consent for this virtual visit at the beginning of the encounter.  Current Medications:  Current Outpatient Medications:    losartan-hydrochlorothiazide (HYZAAR) 50-12.5 MG tablet, Take 1 tablet by mouth daily., Disp: 30 tablet, Rfl: 0   Medications ordered in this encounter:  Meds ordered this encounter  Medications   losartan-hydrochlorothiazide (HYZAAR) 50-12.5 MG tablet    Sig: Take 1 tablet by mouth daily.    Dispense:  30 tablet    Refill:  0    Order Specific Question:   Supervising Provider    Answer:   Sabra Heck, BRIAN [[id number]]     *If you need refills on other medications prior to your next appointment, please contact your pharmacy*  Follow-Up: Call back or seek an in-person evaluation if the symptoms worsen or if the condition fails to improve as anticipated.   If you have been instructed to have an in-person evaluation today at a local Urgent Care facility, please use the link below. It will take you to a list of all of our available [facility], including address, phone number and hours of operation. Please do not delay care.  [facility]  If you or a family member do not have a primary care provider, use the link below to schedule a visit and establish care. When you choose a [facility] primary care physician or advanced practice provider, you gain a long-term partner in health. Find a Primary Care Provider  Learn more about [facility]'s in-office and virtual care options: [facility]

## 2021-08-08 NOTE — Progress Notes (Signed)
Virtual Visit Consent   Wendy Simpson, you are scheduled for a virtual visit with a [facility] provider today.     Just as with appointments in the office, your consent must be obtained to participate.  Your consent will be active for this visit and any virtual visit you may have with one of our providers in the next 365 days.     If you have a MyChart account, a copy of this consent can be sent to you electronically.  All virtual visits are billed to your insurance company just like a traditional visit in the office.    As this is a virtual visit, video technology does not allow for your provider to perform a traditional examination.  This may limit your provider's ability to fully assess your condition.  If your provider identifies any concerns that need to be evaluated in person or the need to arrange testing (such as labs, EKG, etc.), we will make arrangements to do so.     Although advances in technology are sophisticated, we cannot ensure that it will always work on either your end or our end.  If the connection with a video visit is poor, the visit may have to be switched to a telephone visit.  With either a video or telephone visit, we are not always able to ensure that we have a secure connection.     I need to obtain your verbal consent now.   Are you willing to proceed with your visit today?    Wendy Simpson has provided verbal consent on 08/08/2021 for a virtual visit (video or telephone).   Gildardo Pounds, NP   Date: 08/08/2021 6:02 PM   Virtual Visit via Video Note   I, Gildardo Pounds, connected with  Wendy Simpson  ([id number], 04-Dec-1975) on 08/08/21 at  6:00 PM EDT by a video-enabled telemedicine application and verified that I am speaking with the correct person using two identifiers.  Location: Patient: Virtual Visit Location Patient: Home Provider: Virtual Visit Location Provider: Home Office   I discussed the limitations of evaluation and management by  telemedicine and the availability of in person appointments. The patient expressed understanding and agreed to proceed.    History of Present Illness: Wendy Simpson is a 45 y.o. who identifies as a female who was assigned female at birth, and is being seen today for medication refill.  HPI:   She has a history HTN and is requesting a refill of her losartan-hctz  50-12.5mg  daily. She has a PCP appointment in November but states she will run out of medications prior to her appointment.    Problems:  Patient Active Problem List   Diagnosis Date Noted   Breast cancer ([place]) 06/15/2021   Hx-TIA (transient ischemic attack) 04/15/2012   CHOLELITHIASIS 07/08/2008   ALLERGIC RHINITIS 06/19/2008   ASTHMA, EXERCISE INDUCED 06/19/2008   HEADACHE 06/19/2008   NEPHROLITHIASIS, HX OF 06/19/2008    Allergies:  Allergies  Allergen Reactions   Codeine     REACTION: Rash   Medications:  Current Outpatient Medications:    losartan-hydrochlorothiazide (HYZAAR) 50-12.5 MG tablet, Take 1 tablet by mouth daily., Disp: 30 tablet, Rfl: 0  Observations/Objective: Patient is well-developed, well-nourished in no acute distress.  Resting comfortably  at home.  Head is normocephalic, atraumatic.  No labored breathing.  Speech is clear and coherent with logical content.  Patient is alert and oriented at baseline.    Assessment and Plan: 1. Primary hypertension -  losartan-hydrochlorothiazide (HYZAAR) 50-12.5 MG tablet; Take 1 tablet by mouth daily.  Dispense: 30 tablet; Refill: 0  2. Medication refill    Follow Up Instructions: I discussed the assessment and treatment plan with the patient. The patient was provided an opportunity to ask questions and all were answered. The patient agreed with the plan and demonstrated an understanding of the instructions.  A copy of instructions were sent to the patient via MyChart unless otherwise noted below.     The patient was advised to call back or seek an  in-person evaluation if the symptoms worsen or if the condition fails to improve as anticipated.  Time:  I spent 10 minutes with the patient via telehealth technology discussing the above problems/concerns  in addition to reviewing pertinent medical history and prior medications.    Gildardo Pounds, NP

## 2021-08-12 ENCOUNTER — Ambulatory Visit (INDEPENDENT_AMBULATORY_CARE_PROVIDER_SITE_OTHER): Payer: BC Managed Care – PPO | Admitting: Plastic Surgery

## 2021-08-12 ENCOUNTER — Encounter: Payer: Self-pay | Admitting: Plastic Surgery

## 2021-08-12 ENCOUNTER — Other Ambulatory Visit: Payer: Self-pay

## 2021-08-12 DIAGNOSIS — Z9889 Other specified postprocedural states: Secondary | ICD-10-CM

## 2021-08-12 NOTE — Progress Notes (Signed)
Patient presents for continued fills of her tissue expanders.  She currently has 395 cc in 535 cc expanders.  Today we added 100 cc to both sides for current volume of 495 cc and 535 cc expanders.  She is happy with this size.  She wants to proceed with expander exchange.  We will plan to schedule that as soon as possible.  All of her questions were answered.

## 2021-08-30 NOTE — Progress Notes (Signed)
Subjective:    Wendy Simpson - 45 y.o. female MRN [id number]  Date of birth: 10/28/1975  HPI  Wendy Simpson is to establish care.   Current issues and/or concerns: HYPERTENSION: Currently taking: see medication list Med Adherence: [x]  Yes    []  No Medication side effects: []  Yes    [x]  No Adherence with salt restriction (low-salt diet): [x]  Yes  []  No Exercise: Yes []  No [x]  Home Monitoring?: [x]  Yes    []  No Home BP results range: [x]  Yes 120's-130's/70's-80's SOB? []  Yes    [x]  No Chest Pain?: []  Yes    [x]  No  2. DECREASED LIBIDO: Reports ongoing for some time. Denies additional symptoms. Reports history of TIA and blood clots.   3. HISTORY OF DOUBLE MASTECTOMY: History of lobular carcinoma in situ of breasts. Reports scheduled for breast reconstruction soon.    ROS per HPI    Health Maintenance:  Health Maintenance Due  Topic Date Due   COVID-19 Vaccine (1) Never done   Pneumococcal Vaccine 4-27 Years old (1 - PCV) Never done   HIV Screening  Never done   Hepatitis C Screening  Never done   PAP SMEAR-Modifier  08/07/2011   TETANUS/TDAP  11/24/2018   COLONOSCOPY (Pts 45-41yrs Insurance coverage will need to be confirmed)  Never done   INFLUENZA VACCINE  Never done     Past Medical History: Patient Active Problem List   Diagnosis Date Noted   Breast cancer ([place]) 06/15/2021   Hx-TIA (transient ischemic attack) 04/15/2012   Cholelithiasis 07/08/2008   Allergic rhinitis 06/19/2008   ASTHMA, EXERCISE INDUCED 06/19/2008   NEPHROLITHIASIS, HX OF 06/19/2008   Asthma, exercise induced 06/19/2008     Social History   reports that she has never smoked. She has never used smokeless tobacco. She reports that she does not use drugs.   Family History  family history includes Breast cancer (age of onset: 103) in her mother; Cancer (age of onset: 40) in her mother; Hypertension (age of onset: 61) in her mother; Hypertension (age of onset: 38) in her father; Kidney  disease in her maternal grandfather; Raynaud syndrome in her mother.   Medications: reviewed and updated   Objective:   Physical Exam BP 122/78 (BP Location: Left Arm, Patient Position: Sitting, Cuff Size: Normal)   Pulse 81   Temp 98.3 F (36.8 C)   Resp 18   Ht 5' 4.02" (1.626 m)   Wt 177 lb 6.4 oz (80.5 kg)   LMP 08/11/2021 (Exact Date)   SpO2 98%   BMI 30.44 kg/m   Physical Exam HENT:     Head: Normocephalic and atraumatic.  Eyes:     Extraocular Movements: Extraocular movements intact.     Conjunctiva/sclera: Conjunctivae normal.     Pupils: Pupils are equal, round, and reactive to light.  Cardiovascular:     Rate and Rhythm: Normal rate and regular rhythm.     Pulses: Normal pulses.     Heart sounds: Normal heart sounds.  Pulmonary:     Effort: Pulmonary effort is normal.     Breath sounds: Normal breath sounds.  Musculoskeletal:     Cervical back: Normal range of motion and neck supple.  Neurological:     General: No focal deficit present.     Mental Status: She is alert and oriented to person, place, and time.  Psychiatric:        Mood and Affect: Mood normal.        Behavior:  Behavior normal.       Assessment & Plan:  1. Encounter to establish care: - Patient presents today to establish care.  - Return for annual physical examination, labs, and health maintenance. Arrive fasting meaning having no food for at least 8 hours prior to appointment. You may have only water or black coffee. Please take scheduled medications as normal.  2. Essential hypertension: - Continue Losartan-Hydrochlorothiazide as prescribed.  - Counseled on blood pressure goal of less than 130/80, low-sodium, DASH diet, medication compliance, 150 minutes of moderate intensity exercise per week as tolerated. Discussed medication compliance, adverse effects. - Follow-up with primary provider in 3 months or sooner if needed.  - losartan-hydrochlorothiazide (HYZAAR) 50-12.5 MG tablet; Take 1  tablet by mouth daily.  Dispense: 90 tablet; Refill: 0  3. Decreased libido: - Will check hormone levels on today.  - Referral to Gynecology for further evaluation and management. - Follicle stimulating hormone - Luteinizing hormone - Ambulatory referral to Gynecology  4. History of bilateral mastectomy: - Patient reports scheduled for breast reconstruction surgery soon.  - Keep all scheduled appointments with [facility].     Patient was given clear instructions to go to Emergency Department or return to medical center if symptoms don't improve, worsen, or new problems develop.The patient verbalized understanding.  I discussed the assessment and treatment plan with the patient. The patient was provided an opportunity to ask questions and all were answered. The patient agreed with the plan and demonstrated an understanding of the instructions.   The patient was advised to call back or seek an in-person evaluation if the symptoms worsen or if the condition fails to improve as anticipated.    Durene Fruits, NP 09/02/2021, 8:47 AM Primary Care at [facility]

## 2021-09-01 ENCOUNTER — Encounter: Payer: Self-pay | Admitting: Surgical

## 2021-09-01 ENCOUNTER — Other Ambulatory Visit: Payer: Self-pay

## 2021-09-01 ENCOUNTER — Ambulatory Visit (INDEPENDENT_AMBULATORY_CARE_PROVIDER_SITE_OTHER): Payer: BC Managed Care – PPO | Admitting: Surgical

## 2021-09-01 VITALS — BP 130/71 | HR 81 | Ht 64.0 in | Wt 180.2 lb

## 2021-09-01 DIAGNOSIS — D0500 Lobular carcinoma in situ of unspecified breast: Secondary | ICD-10-CM

## 2021-09-01 DIAGNOSIS — Z9889 Other specified postprocedural states: Secondary | ICD-10-CM

## 2021-09-01 MED ORDER — ONDANSETRON HCL 4 MG PO TABS: 4.0000 mg | ORAL_TABLET | Freq: Three times a day (TID) | ORAL | 0 refills | Status: DC | PRN

## 2021-09-01 NOTE — Progress Notes (Signed)
Patient ID: Wendy Simpson, female    DOB: 25-Nov-1975, 45 y.o.   MRN: [id number]  Chief Complaint  Patient presents with   Pre-op Exam      ICD-10-CM   1. 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 S/P breast reconstruction  Z98.890       History of Present Illness: Wendy Simpson is a 45 y.o.  female  with a history of bilateral mastectomies with immediate reconstruction, currently has expanders in place.  She presents for preoperative evaluation for upcoming procedure, Removal of bilateral tissue expanders and placement of bilateral breast implants, scheduled for 09/14/21 with Dr. Claudia Desanctis.  Hx of PONV, no other issues with anesthesia. Personal hx of DVT when in her 16s while on OCP. No other personal hx of DVT. Family hx of DVT (mother and maternal grandfather).  No family or personal history of bleeding or clotting disorders.  Patient is not currently taking any blood thinners.  No history of cardiac disease.  Summary of Previous Visit: Current size of expanders: 495/535 bilaterally.  PMH Significant for: PONV, HTN, Hx of TIA at age 66 - no complications since, not currently on anticoagulants. Hx of varicose veins.  She is feeling well, ready for surgery. No issues at this time. 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    Past Surgical History: Past Surgical History:  Procedure Laterality Date   BREAST RECONSTRUCTION WITH PLACEMENT OF TISSUE EXPANDER  AND FLEX HD (ACELLULAR HYDRATED DERMIS) Bilateral 06/15/2021   Procedure: BILATERAL BREAST RECONSTRUCTION WITH PLACEMENT OF TISSUE EXPANDERS AND FLEX HD (ACELLULAR HYDRATED DERMIS);  Surgeon: [name], MD;  Location: [facility];  Service: Plastics;  Laterality: Bilateral;   [place] SURGERY  07/2010   KIDNEY STONE SURGERY  07/2011   NIPPLE SPARING MASTECTOMY Bilateral 06/15/2021   Procedure: BILATERAL NIPPLE SPARING MASTECTOMY;  Surgeon: Coralie Keens, MD;  Location: [facility];  Service: General;  Laterality: Bilateral;   TUBAL LIGATION  2002    Social History: Social History   Socioeconomic History   Marital status: Married    Spouse name: Not on file   Number of children: Not on file   Years of education: Not on file   Highest education level: Not on file  Occupational History   Not on file  Tobacco Use   Smoking status: Never   Smokeless tobacco: Never  Vaping Use   Vaping Use: Never used  Substance and Sexual Activity   Alcohol use: Not on file   Drug use: Never   Sexual activity: Never  Other Topics Concern   Not on file  Social History Narrative   Not on file   Social Determinants of Health   Financial Resource Strain: Not on file  Food Insecurity: Not on file  Transportation Needs: Not on file  Physical Activity: Not  on file  Stress: Not on file  Social Connections: Not on file  Intimate Partner Violence: Not on file    Family History: Family History  Problem Relation Age of Onset   Cancer Mother 72       L BREAST   Hypertension Mother 33   Raynaud syndrome Mother        NOT SURE AGE   Breast cancer Mother 68   Hypertension Father 73   Kidney disease Maternal Grandfather     Review of Systems: Review of Systems  Constitutional:  Negative for chills and fever.  Cardiovascular: Negative.   Gastrointestinal: Negative.   Skin: Negative.   Neurological: Negative.    Physical  Exam: Vital Signs BP 130/71 (BP Location: Left Arm, Patient Position: Sitting, Cuff Size: Normal)   Pulse 81   Ht 5\' 4"  (1.626 m)   Wt 180 lb 3.2 oz (81.7 kg)   LMP 08/11/2021 (Exact Date)   SpO2 100%   BMI 30.93 kg/m   Physical Exam  Constitutional:      General: Not in acute distress.    Appearance: Normal appearance. Not ill-appearing.  HENT:     Head: Normocephalic and atraumatic.  Eyes:     Pupils: Pupils are equal, round Neck:     Musculoskeletal: Normal range of motion.  Cardiovascular:     Rate and Rhythm: Normal rate    Pulses: Normal pulses.  Pulmonary:     Effort: Pulmonary effort is normal. No respiratory distress.  Abdominal:     General: Abdomen is flat. There is no distension.  Musculoskeletal: Normal range of motion.  Skin:    General: Skin is warm and dry.     Findings: No erythema or rash.  Neurological:     General: No focal deficit present.     Mental Status: Alert and oriented to person, place, and time. Mental status is at baseline.     Motor: No weakness.  Psychiatric:        Mood and Affect: Mood normal.        Behavior: Behavior normal.    Assessment/Plan: The patient is scheduled for exchange of bilateral tissue expanders for bilateral breast implants with Dr. Claudia Desanctis.  Risks, benefits, and alternatives of procedure discussed, questions answered and consent obtained.    Smoking Status: non smoker; Counseling Given? N/a Last Mammogram: s/p bilateral mastectomy  Caprini Score: 12, highest; Risk Factors include: age, BMI > 25, PMHx of DVT while on OCP, fmhx of DVT, personal hx of TIA while on OCP and length of planned surgery. Recommendation for mechanical prophylaxis. Given the DVT and TIA were provoked by OCPs, will likely hold post-op chemoprophylaxis. Encourage early ambulation.   Pictures obtained: 07/22/21  Post-op Rx sent to pharmacy:  [facility]  Patient was provided with the General Surgical Risk consent document and Pain Medication  Agreement prior to their appointment.  They had adequate time to read through the risk consent documents and Pain Medication Agreement. We also discussed them in person together during this preop appointment. All of their questions were answered to their satisfaction.  Recommended calling if they have any further questions.  Risk consent form and Pain Medication Agreement to be scanned into patient's chart.  Patient was provided with the [employer] implant patient decision checklist and this was completed during today's preoperative evaluation. Patient had time to read through the information and any questions were answered to their content. Form will be scanned into patient's chart.  The risks that can  be encountered with and after placement of a breast implant were discussed and include the following but not limited to these: bleeding, infection, delayed healing, anesthesia risks, skin sensation changes, injury to structures including nerves, blood vessels, and muscles which may be temporary or permanent, allergies to tape, suture materials and glues, blood products, topical preparations or injected agents, skin contour irregularities, skin discoloration and swelling, deep vein thrombosis, cardiac and pulmonary complications, pain, which may persist, fluid accumulation, wrinkling of the skin over the implanmt, changes in nipple or breast sensation, implant leakage or rupture, faulty position of the implant, persistent pain, formation of tight scar tissue around the implant (capsular contracture).    Electronically signed by: Carola Rhine Issacc Merlo, PA-C 09/01/2021 8:27 AM

## 2021-09-01 NOTE — H&P (View-Only) (Signed)
Patient ID: Wendy Simpson, female    DOB: Aug 04, 1976, 45 y.o.   MRN: [id number]  Chief Complaint  Patient presents with   Pre-op Exam      ICD-10-CM   1. 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 S/P breast reconstruction  Z98.890       History of Present Illness: Wendy Simpson is a 45 y.o.  female  with a history of bilateral mastectomies with immediate reconstruction, currently has expanders in place.  She presents for preoperative evaluation for upcoming procedure, Removal of bilateral tissue expanders and placement of bilateral breast implants, scheduled for 09/14/21 with Dr. Claudia Desanctis.  Hx of PONV, no other issues with anesthesia. Personal hx of DVT when in her 68s while on OCP. No other personal hx of DVT. Family hx of DVT (mother and maternal grandfather).  No family or personal history of bleeding or clotting disorders.  Patient is not currently taking any blood thinners.  No history of cardiac disease.  Summary of Previous Visit: Current size of expanders: 495/535 bilaterally.  PMH Significant for: PONV, HTN, Hx of TIA at age 84 - no complications since, not currently on anticoagulants. Hx of varicose veins.  She is feeling well, ready for surgery. No issues at this time. 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    Past Surgical History: Past Surgical History:  Procedure Laterality Date   BREAST RECONSTRUCTION WITH PLACEMENT OF TISSUE EXPANDER  AND FLEX HD (ACELLULAR HYDRATED DERMIS) Bilateral 06/15/2021   Procedure: BILATERAL BREAST RECONSTRUCTION WITH PLACEMENT OF TISSUE EXPANDERS AND FLEX HD (ACELLULAR HYDRATED DERMIS);  Surgeon: [name], MD;  Location: [place];  Service: Plastics;  Laterality: Bilateral;   [place] SURGERY  07/2010   KIDNEY STONE SURGERY  07/2011   NIPPLE SPARING MASTECTOMY Bilateral 06/15/2021   Procedure: BILATERAL NIPPLE SPARING MASTECTOMY;  Surgeon: Coralie Keens, MD;  Location: [place];  Service: General;  Laterality: Bilateral;   TUBAL LIGATION  2002    Social History: Social History   Socioeconomic History   Marital status: Married    Spouse name: Not on file   Number of children: Not on file   Years of education: Not on file   Highest education level: Not on file  Occupational History   Not on file  Tobacco Use   Smoking status: Never   Smokeless tobacco: Never  Vaping Use   Vaping Use: Never used  Substance and Sexual Activity   Alcohol use: Not on file   Drug use: Never   Sexual activity: Never  Other Topics Concern   Not on file  Social History Narrative   Not on file   Social Determinants of Health   Financial Resource Strain: Not on file  Food Insecurity: Not on file  Transportation Needs: Not on file  Physical Activity: Not  on file  Stress: Not on file  Social Connections: Not on file  Intimate Partner Violence: Not on file    Family History: Family History  Problem Relation Age of Onset   Cancer Mother 73       L BREAST   Hypertension Mother 76   Raynaud syndrome Mother        NOT SURE AGE   Breast cancer Mother 67   Hypertension Father 49   Kidney disease Maternal Grandfather     Review of Systems: Review of Systems  Constitutional:  Negative for chills and fever.  Cardiovascular: Negative.   Gastrointestinal: Negative.   Skin: Negative.   Neurological: Negative.    Physical  Exam: Vital Signs BP 130/71 (BP Location: Left Arm, Patient Position: Sitting, Cuff Size: Normal)   Pulse 81   Ht 5\' 4"  (1.626 m)   Wt 180 lb 3.2 oz (81.7 kg)   LMP 08/11/2021 (Exact Date)   SpO2 100%   BMI 30.93 kg/m   Physical Exam  Constitutional:      General: Not in acute distress.    Appearance: Normal appearance. Not ill-appearing.  HENT:     Head: Normocephalic and atraumatic.  Eyes:     Pupils: Pupils are equal, round Neck:     Musculoskeletal: Normal range of motion.  Cardiovascular:     Rate and Rhythm: Normal rate    Pulses: Normal pulses.  Pulmonary:     Effort: Pulmonary effort is normal. No respiratory distress.  Abdominal:     General: Abdomen is flat. There is no distension.  Musculoskeletal: Normal range of motion.  Skin:    General: Skin is warm and dry.     Findings: No erythema or rash.  Neurological:     General: No focal deficit present.     Mental Status: Alert and oriented to person, place, and time. Mental status is at baseline.     Motor: No weakness.  Psychiatric:        Mood and Affect: Mood normal.        Behavior: Behavior normal.    Assessment/Plan: The patient is scheduled for exchange of bilateral tissue expanders for bilateral breast implants with Dr. Claudia Desanctis.  Risks, benefits, and alternatives of procedure discussed, questions answered and consent obtained.    Smoking Status: non smoker; Counseling Given? N/a Last Mammogram: s/p bilateral mastectomy  Caprini Score: 12, highest; Risk Factors include: age, BMI > 25, PMHx of DVT while on OCP, fmhx of DVT, personal hx of TIA while on OCP and length of planned surgery. Recommendation for mechanical prophylaxis. Given the DVT and TIA were provoked by OCPs, will likely hold post-op chemoprophylaxis. Encourage early ambulation.   Pictures obtained: 07/22/21  Post-op Rx sent to pharmacy:  [facility]  Patient was provided with the General Surgical Risk consent document and Pain Medication  Agreement prior to their appointment.  They had adequate time to read through the risk consent documents and Pain Medication Agreement. We also discussed them in person together during this preop appointment. All of their questions were answered to their satisfaction.  Recommended calling if they have any further questions.  Risk consent form and Pain Medication Agreement to be scanned into patient's chart.  Patient was provided with the Mentor implant patient decision checklist and this was completed during today's preoperative evaluation. Patient had time to read through the information and any questions were answered to their content. Form will be scanned into patient's chart.  The risks that can  be encountered with and after placement of a breast implant were discussed and include the following but not limited to these: bleeding, infection, delayed healing, anesthesia risks, skin sensation changes, injury to structures including nerves, blood vessels, and muscles which may be temporary or permanent, allergies to tape, suture materials and glues, blood products, topical preparations or injected agents, skin contour irregularities, skin discoloration and swelling, deep vein thrombosis, cardiac and pulmonary complications, pain, which may persist, fluid accumulation, wrinkling of the skin over the implanmt, changes in nipple or breast sensation, implant leakage or rupture, faulty position of the implant, persistent pain, formation of tight scar tissue around the implant (capsular contracture).    Electronically signed by: Carola Rhine Keylin Ferryman, PA-C 09/01/2021 8:27 AM

## 2021-09-02 ENCOUNTER — Encounter: Payer: Self-pay | Admitting: Family

## 2021-09-02 ENCOUNTER — Ambulatory Visit (INDEPENDENT_AMBULATORY_CARE_PROVIDER_SITE_OTHER): Payer: BC Managed Care – PPO | Admitting: Family

## 2021-09-02 VITALS — BP 122/78 | HR 81 | Temp 98.3°F | Resp 18 | Ht 64.02 in | Wt 177.4 lb

## 2021-09-02 DIAGNOSIS — R6882 Decreased libido: Secondary | ICD-10-CM

## 2021-09-02 DIAGNOSIS — I10 Essential (primary) hypertension: Secondary | ICD-10-CM

## 2021-09-02 DIAGNOSIS — Z7689 Persons encountering health services in other specified circumstances: Secondary | ICD-10-CM | POA: Diagnosis not present

## 2021-09-02 DIAGNOSIS — Z9013 Acquired absence of bilateral breasts and nipples: Secondary | ICD-10-CM

## 2021-09-02 MED ORDER — LOSARTAN POTASSIUM-HCTZ 50-12.5 MG PO TABS: 1.0000 | ORAL_TABLET | Freq: Every day | ORAL | 0 refills | Status: DC

## 2021-09-02 NOTE — Progress Notes (Signed)
Pt presents to establish care, pt states she will be having a breast reconstruction surgery

## 2021-09-02 NOTE — Patient Instructions (Signed)
Thank you for choosing Primary Care at [facility] for your medical home!    Wendy Simpson was seen by Camillia Herter, NP today.   Drucie Opitz Olberding's primary care provider is Camillia Herter, NP.   For the best care possible,  you should try to see Durene Fruits, NP whenever you come to clinic.   We look forward to seeing you again soon!  If you have any questions about your visit today,  please call us at [phone]  Or feel free to reach your provider via [place].    Keeping you healthy   Get these tests Blood pressure- Have your blood pressure checked once a year by your healthcare provider.  Normal blood pressure is 120/80. Weight- Have your body mass index (BMI) calculated to screen for obesity.  BMI is a measure of body fat based on height and weight. You can also calculate your own BMI at GravelBags.it. Cholesterol- Have your cholesterol checked regularly starting at age 42, sooner may be necessary if you have diabetes, high blood pressure, if a family member developed heart diseases at an early age or if you smoke.  Chlamydia, HIV, and other sexual transmitted disease- Get screened each year until the age of 83 then within three months of each new sexual partner. Diabetes- Have your blood sugar checked regularly if you have high blood pressure, high cholesterol, a family history of diabetes or if you are overweight.   Get these vaccines Flu shot- Every fall. Tetanus shot- Every 10 years. Menactra- Single dose; prevents meningitis.   Take these steps Don't smoke- If you do smoke, ask your healthcare provider about quitting. For tips on how to quit, go to www.smokefree.gov or call 1-800-QUIT-NOW. Be physically active- Exercise 5 days a week for at least 30 minutes.  If you are not already physically active start slow and gradually work up to 30 minutes of moderate physical activity.  Examples of moderate activity include walking briskly, mowing the yard, dancing,  swimming bicycling, etc. Eat a healthy diet- Eat a variety of healthy foods such as fruits, vegetables, low fat milk, low fat cheese, yogurt, lean meats, poultry, fish, beans, tofu, etc.  For more information on healthy eating, go to www.thenutritionsource.org Drink alcohol in moderation- Limit alcohol intake two drinks or less a day.  Never drink and drive. Dentist- Brush and floss teeth twice daily; visit your dentis twice a year. Depression-Your emotional health is as important as your physical health.  If you're feeling down, losing interest in things you normally enjoy please talk with your healthcare provider. Gun Safety- If you keep a gun in your home, keep it unloaded and with the safety lock on.  Bullets should be stored separately. Helmet use- Always wear a helmet when riding a motorcycle, bicycle, rollerblading or skateboarding. Safe sex- If you may be exposed to a sexually transmitted infection, use a condom Seat belts- Seat bels can save your life; always wear one. Smoke/Carbon Monoxide detectors- These detectors need to be installed on the appropriate level of your home.  Replace batteries at least once a year. Skin Cancer- When out in the sun, cover up and use sunscreen SPF 15 or higher. Violence- If anyone is threatening or hurting you, please tell your healthcare provider.

## 2021-09-03 LAB — LUTEINIZING HORMONE: LH: 4.5 m[IU]/mL

## 2021-09-03 LAB — FOLLICLE STIMULATING HORMONE: FSH: 3.3 m[IU]/mL

## 2021-09-03 NOTE — Progress Notes (Signed)
Hormones (FSH and LH) normal.

## 2021-09-07 ENCOUNTER — Other Ambulatory Visit: Payer: Self-pay

## 2021-09-07 ENCOUNTER — Encounter (HOSPITAL_BASED_OUTPATIENT_CLINIC_OR_DEPARTMENT_OTHER): Payer: Self-pay | Admitting: Plastic Surgery

## 2021-09-08 ENCOUNTER — Encounter (HOSPITAL_BASED_OUTPATIENT_CLINIC_OR_DEPARTMENT_OTHER)
Admission: RE | Admit: 2021-09-08 | Discharge: 2021-09-08 | Disposition: A | Discharge status: Home / Self Care | Payer: BC Managed Care – PPO | Source: Ambulatory Visit | Attending: Plastic Surgery | Admitting: Plastic Surgery

## 2021-09-08 ENCOUNTER — Telehealth: Payer: Self-pay | Admitting: Radiology

## 2021-09-08 DIAGNOSIS — Z01812 Encounter for preprocedural laboratory examination: Secondary | ICD-10-CM | POA: Diagnosis not present

## 2021-09-08 DIAGNOSIS — I10 Essential (primary) hypertension: Secondary | ICD-10-CM | POA: Diagnosis not present

## 2021-09-08 LAB — BASIC METABOLIC PANEL
Anion gap: 9 (ref 5–15)
BUN: 14 mg/dL (ref 6–20)
CO2: 24 mmol/L (ref 22–32)
Calcium: 8.9 mg/dL (ref 8.9–10.3)
Chloride: 104 mmol/L (ref 98–111)
Creatinine, Ser: 0.57 mg/dL (ref 0.44–1.00)
GFR, Estimated: >60 mL/min (ref >60)
Glucose, Bld: 91 mg/dL (ref 70–99)
Potassium: 4 mmol/L (ref 3.5–5.1)
Sodium: 137 mmol/L (ref 135–145)

## 2021-09-08 NOTE — Progress Notes (Signed)
CHG soap given with instruction sheet. Pt verbalized understanding and stated she used it last time.

## 2021-09-08 NOTE — Telephone Encounter (Signed)
Left message for patient to call [facility] to schedule New GYN appointment from referral for decreased libido.

## 2021-09-13 NOTE — Anesthesia Preprocedure Evaluation (Addendum)
Anesthesia Evaluation  Patient identified by MRN, date of birth, ID band Patient awake    Reviewed: Allergy & Precautions, NPO status , Patient's Chart, lab work & pertinent test results  History of Anesthesia Complications (+) PONV and history of anesthetic complications  Airway Mallampati: II  TM Distance: >3 FB Neck ROM: Full    Dental no notable dental hx.    Pulmonary asthma ,    Pulmonary exam normal        Cardiovascular hypertension, Pt. on medications Normal cardiovascular exam     Neuro/Psych Anxiety Depression CVA negative psych ROS   GI/Hepatic negative GI ROS, Neg liver ROS,   Endo/Other  negative endocrine ROS  Renal/GU negative Renal ROS  negative genitourinary   Musculoskeletal negative musculoskeletal ROS (+)   Abdominal   Peds  Hematology negative hematology ROS (+)   Anesthesia Other Findings Breast ca  Reproductive/Obstetrics negative OB ROS                            Anesthesia Physical Anesthesia Plan  ASA: 3  Anesthesia Plan: General   Post-op Pain Management: Tylenol PO (pre-op)   Induction: Intravenous  PONV Risk Score and Plan: 4 or greater and Scopolamine patch - Pre-op, Treatment may vary due to age or medical condition, Midazolam, Propofol infusion, TIVA, Ondansetron and Dexamethasone  Airway Management Planned: Oral ETT  Additional Equipment: None  Intra-op Plan:   Post-operative Plan: Extubation in OR  Informed Consent: I have reviewed the patients History and Physical, chart, labs and discussed the procedure including the risks, benefits and alternatives for the proposed anesthesia with the patient or authorized representative who has indicated his/her understanding and acceptance.     Dental advisory given  Plan Discussed with: CRNA  Anesthesia Plan Comments:        Anesthesia Quick Evaluation

## 2021-09-14 ENCOUNTER — Encounter (HOSPITAL_BASED_OUTPATIENT_CLINIC_OR_DEPARTMENT_OTHER): Payer: Self-pay | Admitting: Plastic Surgery

## 2021-09-14 ENCOUNTER — Other Ambulatory Visit: Payer: Self-pay

## 2021-09-14 ENCOUNTER — Ambulatory Visit (HOSPITAL_BASED_OUTPATIENT_CLINIC_OR_DEPARTMENT_OTHER): Payer: BC Managed Care – PPO | Admitting: Anesthesiology

## 2021-09-14 ENCOUNTER — Ambulatory Visit (HOSPITAL_BASED_OUTPATIENT_CLINIC_OR_DEPARTMENT_OTHER)
Admission: RE | Admit: 2021-09-14 | Discharge: 2021-09-14 | Disposition: A | Discharge status: Home / Self Care | Payer: BC Managed Care – PPO | Attending: Plastic Surgery | Admitting: Plastic Surgery

## 2021-09-14 ENCOUNTER — Encounter (HOSPITAL_BASED_OUTPATIENT_CLINIC_OR_DEPARTMENT_OTHER)
Admission: RE | Disposition: A | Discharge status: Home / Self Care | Payer: Self-pay | Source: Home / Self Care | Attending: Plastic Surgery

## 2021-09-14 DIAGNOSIS — I10 Essential (primary) hypertension: Secondary | ICD-10-CM | POA: Diagnosis not present

## 2021-09-14 DIAGNOSIS — Z79899 Other long term (current) drug therapy: Secondary | ICD-10-CM | POA: Insufficient documentation

## 2021-09-14 DIAGNOSIS — Z853 Personal history of malignant neoplasm of breast: Secondary | ICD-10-CM | POA: Diagnosis not present

## 2021-09-14 DIAGNOSIS — Z9013 Acquired absence of bilateral breasts and nipples: Secondary | ICD-10-CM | POA: Diagnosis not present

## 2021-09-14 DIAGNOSIS — Z8673 Personal history of transient ischemic attack (TIA), and cerebral infarction without residual deficits: Secondary | ICD-10-CM | POA: Diagnosis not present

## 2021-09-14 DIAGNOSIS — Z421 Encounter for breast reconstruction following mastectomy: Secondary | ICD-10-CM | POA: Insufficient documentation

## 2021-09-14 DIAGNOSIS — D0500 Lobular carcinoma in situ of unspecified breast: Secondary | ICD-10-CM | POA: Diagnosis not present

## 2021-09-14 DIAGNOSIS — Z86718 Personal history of other venous thrombosis and embolism: Secondary | ICD-10-CM | POA: Diagnosis not present

## 2021-09-14 DIAGNOSIS — J309 Allergic rhinitis, unspecified: Secondary | ICD-10-CM | POA: Diagnosis not present

## 2021-09-14 DIAGNOSIS — Z9889 Other specified postprocedural states: Secondary | ICD-10-CM | POA: Diagnosis not present

## 2021-09-14 HISTORY — PX: REMOVAL OF BILATERAL TISSUE EXPANDERS WITH PLACEMENT OF BILATERAL BREAST IMPLANTS: SHX6431

## 2021-09-14 LAB — POCT PREGNANCY, URINE: Preg Test, Ur: NEGATIVE

## 2021-09-14 SURGERY — REMOVAL, TISSUE EXPANDER, BREAST, BILATERAL, WITH BILATERAL IMPLANT IMPLANT INSERTION
Anesthesia: General | Site: Breast | Laterality: Bilateral

## 2021-09-14 MED ORDER — DEXMEDETOMIDINE (PRECEDEX) IN NS 20 MCG/5ML (4 MCG/ML) IV SYRINGE
PREFILLED_SYRINGE | INTRAVENOUS | Status: DC | PRN
  Administered 2021-09-14: 16 ug via INTRAVENOUS

## 2021-09-14 MED ORDER — FENTANYL CITRATE (PF) 100 MCG/2ML IJ SOLN
INTRAMUSCULAR | Status: AC
  Filled 2021-09-14: qty 2

## 2021-09-14 MED ORDER — LACTATED RINGERS IV SOLN: INTRAVENOUS | Status: DC

## 2021-09-14 MED ORDER — SCOPOLAMINE 1 MG/3DAYS TD PT72
MEDICATED_PATCH | TRANSDERMAL | Status: AC
  Filled 2021-09-14: qty 1

## 2021-09-14 MED ORDER — OXYCODONE HCL 5 MG PO TABS: 5.0000 mg | ORAL_TABLET | Freq: Once | ORAL | Status: DC | PRN

## 2021-09-14 MED ORDER — CEFAZOLIN SODIUM-DEXTROSE 2-4 GM/100ML-% IV SOLN
2.0000 g | INTRAVENOUS | Status: AC
  Administered 2021-09-14: 2 g via INTRAVENOUS

## 2021-09-14 MED ORDER — CHLORHEXIDINE GLUCONATE CLOTH 2 % EX PADS: 6.0000 | MEDICATED_PAD | Freq: Once | CUTANEOUS | Status: DC

## 2021-09-14 MED ORDER — MIDAZOLAM HCL 5 MG/5ML IJ SOLN
INTRAMUSCULAR | Status: DC | PRN
Start: 2021-09-14 — End: 2021-09-14
  Administered 2021-09-14: 2 mg via INTRAVENOUS

## 2021-09-14 MED ORDER — ONDANSETRON HCL 4 MG/2ML IJ SOLN
INTRAMUSCULAR | Status: DC | PRN
  Administered 2021-09-14: 4 mg via INTRAVENOUS

## 2021-09-14 MED ORDER — PROPOFOL 10 MG/ML IV BOLUS
INTRAVENOUS | Status: DC | PRN
  Administered 2021-09-14: 200 mg via INTRAVENOUS

## 2021-09-14 MED ORDER — ACETAMINOPHEN 500 MG PO TABS
ORAL_TABLET | ORAL | Status: AC
  Filled 2021-09-14: qty 2

## 2021-09-14 MED ORDER — MIDAZOLAM HCL 2 MG/2ML IJ SOLN
INTRAMUSCULAR | Status: AC
  Filled 2021-09-14: qty 2

## 2021-09-14 MED ORDER — PROMETHAZINE HCL 25 MG/ML IJ SOLN: 6.2500 mg | INTRAMUSCULAR | Status: DC | PRN

## 2021-09-14 MED ORDER — PROPOFOL 500 MG/50ML IV EMUL
INTRAVENOUS | Status: DC | PRN
  Administered 2021-09-14: 125 ug/kg/min via INTRAVENOUS

## 2021-09-14 MED ORDER — FENTANYL CITRATE (PF) 100 MCG/2ML IJ SOLN: 25.0000 ug | INTRAMUSCULAR | Status: DC | PRN

## 2021-09-14 MED ORDER — SODIUM CHLORIDE 0.9 % IV SOLN
INTRAVENOUS | Status: DC | PRN
  Administered 2021-09-14: 500 mL

## 2021-09-14 MED ORDER — BUPIVACAINE-EPINEPHRINE 0.25% -1:200000 IJ SOLN
INTRAMUSCULAR | Status: DC | PRN
  Administered 2021-09-14: 30 mL

## 2021-09-14 MED ORDER — DEXMEDETOMIDINE (PRECEDEX) IN NS 20 MCG/5ML (4 MCG/ML) IV SYRINGE
PREFILLED_SYRINGE | INTRAVENOUS | Status: AC
  Filled 2021-09-14: qty 25

## 2021-09-14 MED ORDER — LIDOCAINE 2% (20 MG/ML) 5 ML SYRINGE
INTRAMUSCULAR | Status: DC | PRN
Start: 2021-09-14 — End: 2021-09-14
  Administered 2021-09-14: 100 mg via INTRAVENOUS

## 2021-09-14 MED ORDER — CEFAZOLIN SODIUM-DEXTROSE 2-4 GM/100ML-% IV SOLN
INTRAVENOUS | Status: AC
  Filled 2021-09-14: qty 100

## 2021-09-14 MED ORDER — PHENYLEPHRINE HCL (PRESSORS) 10 MG/ML IV SOLN
INTRAVENOUS | Status: DC | PRN
  Administered 2021-09-14 (×2): 80 ug via INTRAVENOUS
  Administered 2021-09-14: 40 ug via INTRAVENOUS

## 2021-09-14 MED ORDER — DEXAMETHASONE SODIUM PHOSPHATE 4 MG/ML IJ SOLN
INTRAMUSCULAR | Status: DC | PRN
Start: 2021-09-14 — End: 2021-09-14
  Administered 2021-09-14: 4 mg via INTRAVENOUS

## 2021-09-14 MED ORDER — SCOPOLAMINE 1 MG/3DAYS TD PT72
1.0000 | MEDICATED_PATCH | Freq: Once | TRANSDERMAL | Status: DC
  Administered 2021-09-14: 1.5 mg via TRANSDERMAL

## 2021-09-14 MED ORDER — OXYCODONE HCL 5 MG/5ML PO SOLN: 5.0000 mg | Freq: Once | ORAL | Status: DC | PRN

## 2021-09-14 MED ORDER — FENTANYL CITRATE (PF) 100 MCG/2ML IJ SOLN
INTRAMUSCULAR | Status: DC | PRN
  Administered 2021-09-14: 25 ug via INTRAVENOUS
  Administered 2021-09-14: 50 ug via INTRAVENOUS
  Administered 2021-09-14 (×2): 25 ug via INTRAVENOUS
  Administered 2021-09-14: 50 ug via INTRAVENOUS
  Administered 2021-09-14: 25 ug via INTRAVENOUS

## 2021-09-14 MED ORDER — ACETAMINOPHEN 500 MG PO TABS
1000.0000 mg | ORAL_TABLET | Freq: Once | ORAL | Status: AC
  Administered 2021-09-14: 1000 mg via ORAL

## 2021-09-14 MED ORDER — SODIUM CHLORIDE 0.9 % IV SOLN
INTRAVENOUS | Status: AC
  Filled 2021-09-14 (×2): qty 10

## 2021-09-14 MED ORDER — BUPIVACAINE-EPINEPHRINE (PF) 0.25% -1:200000 IJ SOLN
INTRAMUSCULAR | Status: AC
  Filled 2021-09-14: qty 30

## 2021-09-14 SURGICAL SUPPLY — 46 items
APL PRP STRL LF DISP 70% ISPRP (MISCELLANEOUS) ×2
BAG DECANTER FOR FLEXI CONT (MISCELLANEOUS) ×2 IMPLANT
BLADE SURG 10 STRL SS (BLADE) ×1 IMPLANT
BNDG CMPR MED 10X6 ELC LF (GAUZE/BANDAGES/DRESSINGS) ×1
BNDG ELASTIC 6X10 VLCR STRL LF (GAUZE/BANDAGES/DRESSINGS) ×2 IMPLANT
CANISTER SUCT 1200ML W/VALVE (MISCELLANEOUS) ×3 IMPLANT
CHLORAPREP W/TINT 26 (MISCELLANEOUS) ×3 IMPLANT
COVER BACK TABLE 60X90IN (DRAPES) ×2 IMPLANT
COVER MAYO STAND STRL (DRAPES) ×2 IMPLANT
DRAPE LAPAROSCOPIC ABDOMINAL (DRAPES) ×2 IMPLANT
DRAPE UTILITY XL STRL (DRAPES) ×2 IMPLANT
DRSG PAD ABDOMINAL 8X10 ST (GAUZE/BANDAGES/DRESSINGS) ×6 IMPLANT
ELECT COATED BLADE 2.86 ST (ELECTRODE) ×2 IMPLANT
ELECT REM PT RETURN 9FT ADLT (ELECTROSURGICAL) ×2
ELECTRODE REM PT RTRN 9FT ADLT (ELECTROSURGICAL) ×1 IMPLANT
FUNNEL KELLER 2 DISP (MISCELLANEOUS) ×1 IMPLANT
GAUZE SPONGE 4X4 12PLY STRL (GAUZE/BANDAGES/DRESSINGS) ×2 IMPLANT
GLOVE SRG 8 PF TXTR STRL LF DI (GLOVE) ×1 IMPLANT
GLOVE SURG ENC TEXT LTX SZ7.5 (GLOVE) ×2 IMPLANT
GLOVE SURG UNDER POLY LF SZ8 (GLOVE) ×2
GOWN STRL REUS W/ TWL LRG LVL3 (GOWN DISPOSABLE) ×2 IMPLANT
GOWN STRL REUS W/TWL LRG LVL3 (GOWN DISPOSABLE) ×6
IMPL BREAST SILICONE 595CC (Breast) IMPLANT
IMPLANT BREAST SILICONE 595CC (Breast) ×4 IMPLANT
NDL HYPO 25X1 1.5 SAFETY (NEEDLE) ×1 IMPLANT
NEEDLE HYPO 25X1 1.5 SAFETY (NEEDLE) ×2 IMPLANT
PACK BASIN DAY SURGERY FS (CUSTOM PROCEDURE TRAY) ×2 IMPLANT
PENCIL SMOKE EVACUATOR (MISCELLANEOUS) ×2 IMPLANT
SIZER BREAST REUSE 560CC (SIZER) ×2
SIZER BREAST REUSE 595CC (SIZER) ×4
SIZER BRST REUSE 595CC (SIZER) IMPLANT
SIZER BRST REUSE P6.3XHI 560CC (SIZER) ×1 IMPLANT
SLEEVE SCD COMPRESS KNEE MED (STOCKING) ×2 IMPLANT
SPONGE T-LAP 18X18 ~~LOC~~+RFID (SPONGE) ×3 IMPLANT
STAPLER INSORB 30 2030 C-SECTI (MISCELLANEOUS) ×1 IMPLANT
STAPLER VISISTAT 35W (STAPLE) ×2 IMPLANT
STRIP SUTURE WOUND CLOSURE 1/2 (MISCELLANEOUS) ×4 IMPLANT
SUT PDS 3-0 CT2 (SUTURE) ×6
SUT PDS II 3-0 CT2 27 ABS (SUTURE) ×2 IMPLANT
SUT VLOC 90 P-14 23 (SUTURE) ×3 IMPLANT
SYR BULB IRRIG 60ML STRL (SYRINGE) ×2 IMPLANT
SYR CONTROL 10ML LL (SYRINGE) ×2 IMPLANT
TOWEL GREEN STERILE FF (TOWEL DISPOSABLE) ×2 IMPLANT
TUBE CONNECTING 20X1/4 (TUBING) ×2 IMPLANT
UNDERPAD 30X36 HEAVY ABSORB (UNDERPADS AND DIAPERS) ×4 IMPLANT
YANKAUER SUCT BULB TIP NO VENT (SUCTIONS) ×2 IMPLANT

## 2021-09-14 NOTE — Anesthesia Postprocedure Evaluation (Signed)
Anesthesia Post Note  Patient: Julianah Marciel Grzelak  Procedure(s) Performed: REMOVAL OF BILATERAL TISSUE EXPANDERS WITH PLACEMENT OF BILATERAL BREAST IMPLANTS (Bilateral: Breast)     Patient location during evaluation: PACU Anesthesia Type: General Level of consciousness: awake and alert and oriented Pain management: pain level controlled Vital Signs Assessment: post-procedure vital signs reviewed and stable Respiratory status: spontaneous breathing, nonlabored ventilation and respiratory function stable Cardiovascular status: blood pressure returned to baseline Postop Assessment: no apparent nausea or vomiting Anesthetic complications: no   No notable events documented.  Last Vitals:  Vitals:   09/14/21 1100 09/14/21 1115  BP: (!) 143/89 (!) 142/99  Pulse: (!) 106 99  Resp: 20 18  Temp:    SpO2: 98% 98%    Last Pain:  Vitals:   09/14/21 1115  TempSrc:   PainSc: [place]

## 2021-09-14 NOTE — Discharge Instructions (Addendum)
Activity As tolerated: NO showers for 3 days. Keep ACE wrap on breasts until then. After showering, put ACE wrap back on, this is important for compression. NO driving while in pain, taking pain medication or if you are unable to safely react to traffic. No heavy activities Take Pain medication (Norco) as needed for severe pain. Otherwise, you can use ibuprofen or tylenol as needed. Avoid more than 3,000 mg of tylenol in 24 hours. Norco has 325mg  of tylenol per dose.  Diet: Regular. Drink plenty of fluids and eat healthy, high protein, low carbs.  Wound Care: Keep dressing clean & dry. You may change bandages after showering if you continue to notice some drainage. You can reuse bandages if they are not dirty/soiled.  Special Instructions: Call Doctor if any unusual problems occur such as pain, excessive Bleeding, unrelieved Nausea/vomiting, Fever &/or chills  Follow-up appointment: Scheduled for next week.  You may have Tylenol after 2:30pm today, if needed.  Post Anesthesia Home Care Instructions  Activity: Get plenty of rest for the remainder of the day. A responsible individual must stay with you for 24 hours following the procedure.  For the next 24 hours, DO NOT: -Drive a car -Paediatric nurse -Drink alcoholic beverages -Take any medication unless instructed by your physician -Make any legal decisions or sign important papers.  Meals: Start with liquid foods such as gelatin or soup. Progress to regular foods as tolerated. Avoid greasy, spicy, heavy foods. If nausea and/or vomiting occur, drink only clear liquids until the nausea and/or vomiting subsides. Call your physician if vomiting continues.  Special Instructions/Symptoms: Your throat may feel dry or sore from the anesthesia or the breathing tube placed in your throat during surgery. If this causes discomfort, gargle with warm salt water. The discomfort should disappear within 24 hours.  If you had a scopolamine patch  placed behind your ear for the management of post- operative nausea and/or vomiting:  1. The medication in the patch is effective for 72 hours, after which it should be removed.  Wrap patch in a tissue and discard in the trash. Wash hands thoroughly with soap and water. 2. You may remove the patch earlier than 72 hours if you experience unpleasant side effects which may include dry mouth, dizziness or visual disturbances. 3. Avoid touching the patch. Wash your hands with soap and water after contact with the patch.

## 2021-09-14 NOTE — Op Note (Signed)
Operative Note   DATE OF OPERATION: 09/14/2021  SURGICAL DEPARTMENT: Plastic Surgery  PREOPERATIVE DIAGNOSES: Bilateral mastectomy defects  POSTOPERATIVE DIAGNOSES:  same  PROCEDURE: Exchange of bilateral breast tissue expanders for gel implants  SURGEON: Talmadge Coventry, MD  ASSISTANT: Verdie Shire, PA The advanced practice practitioner (APP) assisted throughout the case.  The APP was essential in retraction and counter traction when needed to make the case progress smoothly.  This retraction and assistance made it possible to see the tissue planes for the procedure.  The assistance was needed for hemostasis, tissue re-approximation and closure of the incision site.   ANESTHESIA:  General.   COMPLICATIONS: None.   INDICATIONS FOR PROCEDURE:  The patient, Wendy Simpson is a 45 y.o. female born on 1976-08-26, is here for treatment of bilateral mastectomy defects in the process of tissue expander to implant reconstruction MRN: [id number]  CONSENT:  Informed consent was obtained directly from the patient. Risks, benefits and alternatives were fully discussed. Specific risks including but not limited to bleeding, infection, hematoma, seroma, scarring, pain, contracture, asymmetry, wound healing problems, and need for further surgery were all discussed. The patient did have an ample opportunity to have questions answered to satisfaction.   DESCRIPTION OF PROCEDURE:  The patient was taken to the operating room. SCDs were placed and antibiotics were given.  General anesthesia was administered.  The patient's operative site was prepped and draped in a sterile fashion. A time out was performed and all information was confirmed to be correct.  Started by making a inferolateral incision on both sides in line with her previous scar.  I then dissected down to the expander with cautery.  Fluid was evacuated and the expanders were removed from each side.  595 cc sizers were placed which gave the  best contour.  Sizers were then removed and the pocket was irrigated on both sides with triple antibiotic solution.  The implants were then brought onto the field.  I used Mentor smooth round high-profile extra implants with 595 cc of volume.  The serial number on the left side is [id number].  Serial number on the right side is [id number].  Both implants were then placed with [employer].  Pockets were closed with interrupted buried 3-0 PDS sutures.  Skin was closed with buried in sorb staples and a running 3 OV lock.  Steri-Strips were applied.  This gave her great on table result.  The patient tolerated the procedure well.  There were no complications. The patient was allowed to wake from anesthesia, extubated and taken to the recovery room in satisfactory condition.

## 2021-09-14 NOTE — Interval H&P Note (Signed)
History and Physical Interval Note:  09/14/2021 9:22 AM  Wendy Simpson  has presented today for surgery, with the diagnosis of S/P breast reconstruction.  The various methods of treatment have been discussed with the patient and family. After consideration of risks, benefits and other options for treatment, the patient has consented to  Procedure(s) with comments: REMOVAL OF BILATERAL TISSUE EXPANDERS WITH PLACEMENT OF BILATERAL BREAST IMPLANTS (Bilateral) - 1.5 hour as a surgical intervention.  The patient's history has been reviewed, patient examined, no change in status, stable for surgery.  I have reviewed the patient's chart and labs.  Questions were answered to the patient's satisfaction.     Wendy Simpson

## 2021-09-14 NOTE — Transfer of Care (Signed)
Immediate Anesthesia Transfer of Care Note  Patient: Wendy Simpson  Procedure(s) Performed: REMOVAL OF BILATERAL TISSUE EXPANDERS WITH PLACEMENT OF BILATERAL BREAST IMPLANTS (Bilateral: Breast)  Patient Location: PACU  Anesthesia Type:General  Level of Consciousness: awake, alert , oriented and patient cooperative  Airway & Oxygen Therapy: Patient Spontanous Breathing and Patient connected to face mask oxygen  Post-op Assessment: Report given to RN and Post -op Vital signs reviewed and stable  Post vital signs: Reviewed and stable  Last Vitals:  Vitals Value Taken Time  BP 134/90 09/14/21 1054  Temp    Pulse 118 09/14/21 1054  Resp 28 09/14/21 1054  SpO2 98 % 09/14/21 1054  Vitals shown include unvalidated device data.  Last Pain:  Vitals:   09/14/21 0832  TempSrc: Oral  PainSc: 0-No pain      Patients Stated Pain Goal: 5 (15/18/34 3735)  Complications: No notable events documented.

## 2021-09-14 NOTE — Anesthesia Procedure Notes (Signed)
Procedure Name: LMA Insertion Date/Time: 09/14/2021 9:58 AM Performed by: Signe Colt, CRNA Pre-anesthesia Checklist: Patient identified, Emergency Drugs available, Suction available and Patient being monitored Patient Re-evaluated:Patient Re-evaluated prior to induction Oxygen Delivery Method: Circle System Utilized Preoxygenation: Pre-oxygenation with 100% oxygen Induction Type: IV induction Ventilation: Mask ventilation without difficulty LMA: LMA inserted LMA Size: 4.0 Number of attempts: 1 Airway Equipment and Method: bite block Placement Confirmation: positive ETCO2 Tube secured with: Tape Dental Injury: Teeth and Oropharynx as per pre-operative assessment

## 2021-09-15 ENCOUNTER — Encounter (HOSPITAL_BASED_OUTPATIENT_CLINIC_OR_DEPARTMENT_OTHER): Payer: Self-pay | Admitting: Plastic Surgery

## 2021-09-22 ENCOUNTER — Encounter: Payer: Self-pay | Admitting: Plastic Surgery

## 2021-09-22 ENCOUNTER — Ambulatory Visit (INDEPENDENT_AMBULATORY_CARE_PROVIDER_SITE_OTHER): Payer: BC Managed Care – PPO | Admitting: Plastic Surgery

## 2021-09-22 ENCOUNTER — Other Ambulatory Visit: Payer: Self-pay

## 2021-09-22 DIAGNOSIS — Z9889 Other specified postprocedural states: Secondary | ICD-10-CM

## 2021-09-22 NOTE — Progress Notes (Signed)
Patient presents 1 week postop from exchange of bilateral breast tissue expanders for gel implants.  She is overall very happy and feels like things are going well.  On exam her incisions are healing nicely.  She has a great result.  No signs of subcutaneous fluid.  We will have her continue to wear compressive bra and follow-up in a few weeks.  All her questions were answered.

## 2021-10-06 ENCOUNTER — Ambulatory Visit (INDEPENDENT_AMBULATORY_CARE_PROVIDER_SITE_OTHER): Payer: BC Managed Care – PPO | Admitting: Surgical

## 2021-10-06 ENCOUNTER — Other Ambulatory Visit: Payer: Self-pay

## 2021-10-06 DIAGNOSIS — Z9889 Other specified postprocedural states: Secondary | ICD-10-CM

## 2021-10-06 DIAGNOSIS — D0500 Lobular carcinoma in situ of unspecified breast: Secondary | ICD-10-CM

## 2021-10-06 NOTE — Progress Notes (Signed)
Patient is a 45 year old female here for follow-up after bilateral breast reconstruction.  She had her expanders removed and implants placed 09/14/2021 with Dr. Claudia Desanctis.  She is 3 weeks postop.  She is doing really well, she has no specific questions.  She does not have any concerns.  Chaperone present on exam On exam bilateral NAC's are viable.  Bilateral breast incisions are intact and healing very well.  There is no erythema or cellulitic changes.  There is no subcutaneous fluid collections.  Bilateral breast capsules are soft.  We discussed restrictions and avoiding strenuous activities for 3 more weeks.  We discussed the use of scar creams.  We discussed continued use of compressive garments.  We provided her with a prescription for [employer] for post reconstruction bras and fitting.  Recommend calling with questions or concerns.  We discussed scheduling additional follow-up in 6 months up to 1 year for continued follow-up.  She is interested in laser of her bilateral leg vessels and she is going to call to set that up.

## 2021-12-01 ENCOUNTER — Telehealth: Payer: Self-pay

## 2021-12-01 NOTE — Telephone Encounter (Signed)
Patient called to say she had surgery with Dr. Claudia Desanctis on 09/14/2021, and she is having pain on her left side.  She said it has been getting worse over the last few days.  She said her expanders have been taken out, but it feels like it did when they were in and there was a piece sticking out.  Please call.

## 2021-12-02 NOTE — Telephone Encounter (Signed)
Returned patients call. LMVM 

## 2021-12-06 ENCOUNTER — Ambulatory Visit: Payer: BC Managed Care – PPO | Admitting: Family

## 2021-12-09 ENCOUNTER — Other Ambulatory Visit: Payer: Self-pay | Admitting: Family

## 2021-12-09 DIAGNOSIS — I10 Essential (primary) hypertension: Secondary | ICD-10-CM

## 2021-12-13 NOTE — Telephone Encounter (Signed)
Called patient back again. Was not able to LM on VM, due to VM is full. Should she call back, offer an appointment to see Dr. Claudia Desanctis in regarding having pain left side of breast. Expanders were removed on 09/14/2021 and replaced with gel implants.

## 2021-12-22 NOTE — Progress Notes (Signed)
? ? ?Patient ID: Wendy Simpson, female    DOB: 08-May-1976  MRN: [id number] ? ?CC: Hypertension Follow-Up  ? ?Subjective: ?Wendy Simpson is a 46 y.o. female who presents for hypertension follow-up.  ? ?Her concerns today include:  ?HYPERTENSION FOLLOW-UP: ?09/02/2021: ?- Continue Losartan-Hydrochlorothiazide as prescribed.  ? ?12/31/2021: ?Doing well on current regimen. No side effects. No issues/concerns. Denies chest pain and shortness of breath. Checking blood pressures at work about the same.  ? ?2. IRREGULAR PERIOD: ?Had two periods for the first time on last month. The first period was 3 days. Then had discharge similar to spotting for several days. Followed by second period for 4 days. She is not taking any hormonal medications. Period for this month due within the next couple weeks.  ? ?3. LEFT HIP PAIN: ?Began 2 weeks ago. She does sleep on left side. Standing and walking a lot at work. 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 Denies any surgical history of left hip or recent injury/trauma. Denies left lower extremity redness, tenderness, warmth, shortness of breath, chest pain and additional red flag symptoms. Posterior knees hurt sometimes. Left hip pain seems to radiate to the left back and left abdomen. Considered if abdominal pain secondary to wearing binder post mastectomy. Taking Ibuprofen helps. Pain has improved since 2 weeks ago but not back to 100% and still lingering. Pain worse in the morning and improves throughout the day.  ? ? ?Patient Active Problem List  ? Diagnosis Date Noted  ? Breast cancer ([place]) 06/15/2021  ? Hx-TIA (transient ischemic attack) 04/15/2012  ? Cholelithiasis 07/08/2008  ? Allergic rhinitis 06/19/2008  ? ASTHMA, EXERCISE INDUCED 06/19/2008  ? NEPHROLITHIASIS, HX OF 06/19/2008  ? Asthma, exercise induced 06/19/2008  ?  ? ?Current Outpatient Medications on File Prior to Visit  ?Medication Sig Dispense Refill  ? vitamin C (ASCORBIC ACID) 250 MG tablet Take  250 mg by mouth daily.    ? ?No current facility-administered medications on file prior to visit.  ? ? ?Allergies  ?Allergen Reactions  ? Codeine   ?  REACTION: Rash  ? ? ?Social History  ? ?Socioeconomic History  ? Marital status: Married  ?  Spouse name: Not on file  ? Number of children: Not on file  ? Years of education: Not on file  ? Highest education level: Not on file  ?Occupational History  ? Not on file  ?Tobacco Use  ? Smoking status: Never  ?  Passive exposure: Never  ? Smokeless tobacco: Never  ?Vaping Use  ? Vaping Use: Never used  ?Substance and Sexual Activity  ? Alcohol use: Not on file  ? Drug use: Never  ? Sexual activity: Never  ?Other Topics Concern  ? Not on file  ?Social History Narrative  ? Not on file  ? ?Social Determinants of Health  ? ?Financial Resource Strain: Not on file  ?Food Insecurity: Not on file  ?Transportation Needs: Not on file  ?Physical Activity: Not on file  ?Stress: Not on file  ?Social Connections: Not on file  ?Intimate Partner Violence: Not on file  ? ? ?Family History  ?Problem Relation Age of Onset  ? Cancer Mother 48  ?     L BREAST  ? Hypertension Mother 56  ? Raynaud syndrome Mother   ?     NOT SURE AGE  ? Breast cancer Mother 4  ? Hypertension Father 1  ? Kidney disease Maternal Grandfather   ? ? ?  Past Surgical History:  ?Procedure Laterality Date  ? BREAST RECONSTRUCTION WITH PLACEMENT OF TISSUE EXPANDER AND FLEX HD (ACELLULAR HYDRATED DERMIS) Bilateral 06/15/2021  ? Procedure: BILATERAL BREAST RECONSTRUCTION WITH PLACEMENT OF TISSUE EXPANDERS AND FLEX HD (ACELLULAR HYDRATED DERMIS);  Surgeon: [name], MD;  Location: [facility];  Service: Plastics;  Laterality: Bilateral;  ? [place]  ? GALLBLADDER SURGERY  07/2010  ? KIDNEY STONE SURGERY  07/2011  ? NIPPLE SPARING MASTECTOMY Bilateral 06/15/2021  ? Procedure: BILATERAL NIPPLE SPARING MASTECTOMY;  Surgeon: Coralie Keens, MD;  Location: [facility];  Service:  General;  Laterality: Bilateral;  ? REMOVAL OF BILATERAL TISSUE EXPANDERS WITH PLACEMENT OF BILATERAL BREAST IMPLANTS Bilateral 09/14/2021  ? Procedure: REMOVAL OF BILATERAL TISSUE EXPANDERS WITH PLACEMENT OF BILATERAL BREAST IMPLANTS;  Surgeon: [name], MD;  Location: [facility];  Service: Plastics;  Laterality: Bilateral;  1.5 hour  ? TUBAL LIGATION  2002  ? ? ?ROS: ?Review of Systems ?Negative except as stated above ? ?PHYSICAL EXAM: ?BP 117/78 (BP Location: Left Arm, Patient Position: Sitting, Cuff Size: Normal)   Pulse 76   Temp 98 ?F (36.7 ?C)   Resp 18   Ht 5' 4.02" (1.626 m)   Wt 187 lb (84.8 kg)   SpO2 97%   BMI 32.08 kg/m?  ? ?Physical Exam ?HENT:  ?   Head: Normocephalic and atraumatic.  ?Eyes:  ?   Extraocular Movements: Extraocular movements intact.  ?   Conjunctiva/sclera: Conjunctivae normal.  ?   Pupils: Pupils are equal, round, and reactive to light.  ?Cardiovascular:  ?   Rate and Rhythm: Normal rate and regular rhythm.  ?   Pulses: Normal pulses.  ?   Heart sounds: Normal heart sounds.  ?Pulmonary:  ?   Effort: Pulmonary effort is normal.  ?   Breath sounds: Normal breath sounds.  ?Musculoskeletal:  ?   Cervical back: Normal range of motion and neck supple.  ?Neurological:  ?   General: No focal deficit present.  ?   Mental Status: She is alert and oriented to person, place, and time.  ?Psychiatric:     ?   Mood and Affect: Mood normal.     ?   Behavior: Behavior normal.  ? ?ASSESSMENT AND PLAN: ?1. Essential (primary) hypertension: ?- Losartan-Hydrochlorothiazide as prescribed.  ?- Counseled on blood pressure goal of less than 130/80, low-sodium, DASH diet, medication compliance, 150 minutes of moderate intensity exercise per week as tolerated. Discussed medication compliance, adverse effects. ?- Follow-up with primary provider in 3 months or sooner if needed.  ?- losartan-hydrochlorothiazide (HYZAAR) 50-12.5 MG tablet; Take 1 tablet by mouth daily.  Dispense: 90  tablet; Refill: 0 ? ?2. Irregular menses: ?- Patient elects referral to Gynecology for further evaluation and management.  ?- Ambulatory referral to Gynecology ? ?3. Left hip pain: ?- Referral to Orthopedic Surgery for further evaluation and management. ?- Ambulatory referral to Orthopedic Surgery ? ? ?Patient was given the opportunity to ask questions.  Patient verbalized understanding of the plan and was able to repeat key elements of the plan. Patient was given clear instructions to go to Emergency Department or return to medical center if symptoms don't improve, worsen, or new problems develop.The patient verbalized understanding. ? ? ?Orders Placed This Encounter  ?Procedures  ? Ambulatory referral to Gynecology  ? Ambulatory referral to Orthopedic Surgery  ? ? ? ?Requested Prescriptions  ? ?Signed Prescriptions Disp Refills  ? losartan-hydrochlorothiazide (HYZAAR)  50-12.5 MG tablet 90 tablet 0  ?  Sig: Take 1 tablet by mouth daily.  ? ? ?Return in about 3 months (around 04/02/2022) for Follow-Up or next available hypertension . ? ?Camillia Herter, NP  ?

## 2021-12-31 ENCOUNTER — Other Ambulatory Visit: Payer: Self-pay

## 2021-12-31 ENCOUNTER — Ambulatory Visit (INDEPENDENT_AMBULATORY_CARE_PROVIDER_SITE_OTHER): Payer: BC Managed Care – PPO | Admitting: Family

## 2021-12-31 ENCOUNTER — Encounter: Payer: Self-pay | Admitting: Family

## 2021-12-31 VITALS — BP 117/78 | HR 76 | Temp 98.0°F | Resp 18 | Ht 64.02 in | Wt 187.0 lb

## 2021-12-31 DIAGNOSIS — N926 Irregular menstruation, unspecified: Secondary | ICD-10-CM

## 2021-12-31 DIAGNOSIS — M25552 Pain in left hip: Secondary | ICD-10-CM

## 2021-12-31 DIAGNOSIS — I10 Essential (primary) hypertension: Secondary | ICD-10-CM

## 2021-12-31 MED ORDER — LOSARTAN POTASSIUM-HCTZ 50-12.5 MG PO TABS: 1.0000 | ORAL_TABLET | Freq: Every day | ORAL | 0 refills | Status: DC

## 2021-12-31 NOTE — Progress Notes (Signed)
Pt presents for hypertension follow-up ?Pt complains of phantom pain on left side, pt unable to explain exactly where located  ?Pt request referral gyn due to had 2 menstrual cycles this month, otherwise usually regular cycles  ?

## 2022-01-06 ENCOUNTER — Ambulatory Visit (INDEPENDENT_AMBULATORY_CARE_PROVIDER_SITE_OTHER): Payer: BC Managed Care – PPO | Admitting: Physician Assistant

## 2022-01-06 ENCOUNTER — Other Ambulatory Visit: Payer: Self-pay

## 2022-01-06 DIAGNOSIS — Z9889 Other specified postprocedural states: Secondary | ICD-10-CM

## 2022-01-06 NOTE — Progress Notes (Signed)
? ?Referring Provider ?Camillia Herter, NP ?[facility] ?[address] ?[place],  [place] [address]  ? ?CC:  ?Chief Complaint  ?Patient presents with  ? Breast Problem  ?   ? ?Wendy Simpson is an 46 y.o. female.  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 Claudia Desanctis. ? ?She was last seen on 10/06/2021.  At that time, patient was doing well and exam was reassuring.  Plan was for continued activity modification and compressive garments for an additional few weeks, but then she would be cleared of restrictions.  Discussed scar creams.  Plan was for her to return in 6 months to 1 year for ongoing follow-up. ? ?Today, patient is doing well.  She does have an area that is superior medial to left nipple that feels cannot and is mildly tender to touch.  Additionally, when she wakes up in the morning she feels as though her inframammary incisions feel "tight".  Patient is also bothered by ridging that extends towards axilla bilaterally.  She states that she has lost 20 pounds and plans to continue losing weight and she is afraid that she will have skin flaps in her axilla.  She has not required any scar creams and states that the incisions have healed nicely. ? ?Overall, she is pleased with her outcome, but certainly wanted to make sure that the knot that she was appreciating was not anything particularly concerning. ? ? ?Allergies  ?Allergen Reactions  ? Codeine   ?  REACTION: Rash  ? ? ?Outpatient Encounter Medications as of 01/06/2022  ?Medication Sig  ? losartan-hydrochlorothiazide (HYZAAR) 50-12.5 MG tablet Take 1 tablet by mouth daily.  ? vitamin C (ASCORBIC ACID) 250 MG tablet Take 250 mg by mouth daily.  ? ?No facility-administered encounter medications on file as of 01/06/2022.  ?  ? ?Past Medical History:  ?Diagnosis Date  ? Anxiety   ? Asthma   ? exercise induced  ? Depression   ? Hypertension   ? PONV  (postoperative nausea and vomiting)   ? Stroke ([place]) 10/24/2001  ? 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  ? ? ?Past Surgical History:  ?Procedure Laterality Date  ? BREAST RECONSTRUCTION WITH PLACEMENT OF TISSUE EXPANDER AND FLEX HD (ACELLULAR HYDRATED DERMIS) Bilateral 06/15/2021  ? Procedure: BILATERAL BREAST RECONSTRUCTION WITH PLACEMENT OF TISSUE EXPANDERS AND FLEX HD (ACELLULAR HYDRATED DERMIS);  Surgeon: [name], MD;  Location: [facility];  Service: Plastics;  Laterality: Bilateral;  ? [place]  ? GALLBLADDER SURGERY  07/2010  ? KIDNEY STONE SURGERY  07/2011  ? NIPPLE SPARING MASTECTOMY Bilateral 06/15/2021  ? Procedure: BILATERAL NIPPLE SPARING MASTECTOMY;  Surgeon: Coralie Keens, MD;  Location: [facility];  Service: General;  Laterality: Bilateral;  ? REMOVAL OF BILATERAL TISSUE EXPANDERS WITH PLACEMENT OF BILATERAL BREAST IMPLANTS Bilateral 09/14/2021  ? Procedure: REMOVAL OF BILATERAL TISSUE EXPANDERS WITH PLACEMENT OF BILATERAL BREAST IMPLANTS;  Surgeon: [name], MD;  Location: [facility];  Service: Plastics;  Laterality: Bilateral;  1.5 hour  ? TUBAL LIGATION  2002  ? ? ?Family History  ?Problem Relation Age of Onset  ? Cancer Mother 21  ?     L BREAST  ? Hypertension Mother 109  ? Raynaud syndrome Mother   ?     NOT SURE AGE  ? Breast cancer Mother 61  ? Hypertension Father 69  ?  Kidney disease Maternal Grandfather   ? ? ?Social History  ? ?Social History Narrative  ? Not on file  ?  ? ?Review of Systems ?General: Denies fevers or chills ?Skin: Denies any wounds ? ?Physical Exam ?Vitals with BMI 12/31/2021 09/14/2021 09/14/2021  ?Height 5' 4.016" - -  ?Weight 187 lbs - -  ?BMI 32.08 - -  ?Systolic 696 295 284  ?Diastolic 78 83 99  ?Pulse 76 103 99  ?  ?General:  No acute distress, nontoxic appearing  ?Respiratory: No increased work of breathing ?Neuro: Alert and oriented ?Psychiatric: Normal mood and affect  ?Breasts: Excellent shape and  symmetry.  NAC's are viable.  Denies sensation to light touch in either breast.  There is a small, less than 1.5 cm firm mobile nodule in left breast, superior medial to nipple.  Mild tenderness to palpation.  No fluctuance or crepitus. ? ?Assessment/Plan ? ?Patient's knot in left breast is likely reflective of fat necrosis.  Encouraging her to gently massage daily.  Anticipate that it will soften over the next few months. ? ?She already has a follow-up appointment scheduled with Dr. Claudia Desanctis.  Encouraging her to discuss possible excision of excess axillary tissue bilaterally.  There does appear to be ridging that may be amenable to excision by extending the inframammary incision laterally. ? ?Patient also reports that she will have breast ultrasound with general surgery at her 1 year postop. ? ?Krista Blue ?01/06/2022, 12:03 PM  ? ? ?  ? ?

## 2022-01-10 IMAGING — MG MM DIGITAL SCREENING BILAT W/ TOMO AND CAD
6 of 10 series · 6 of 30 positions shown · non-contrast
Comparison: None.

CLINICAL DATA: Screening.

EXAM:
DIGITAL SCREENING BILATERAL MAMMOGRAM WITH TOMOSYNTHESIS AND CAD
TECHNIQUE: Bilateral screening digital craniocaudal and mediolateral oblique
mammograms were obtained. Bilateral screening digital breast
tomosynthesis was performed. The images were evaluated with
computer-aided detection.

[R MLO synth-2D (1 of 2)]
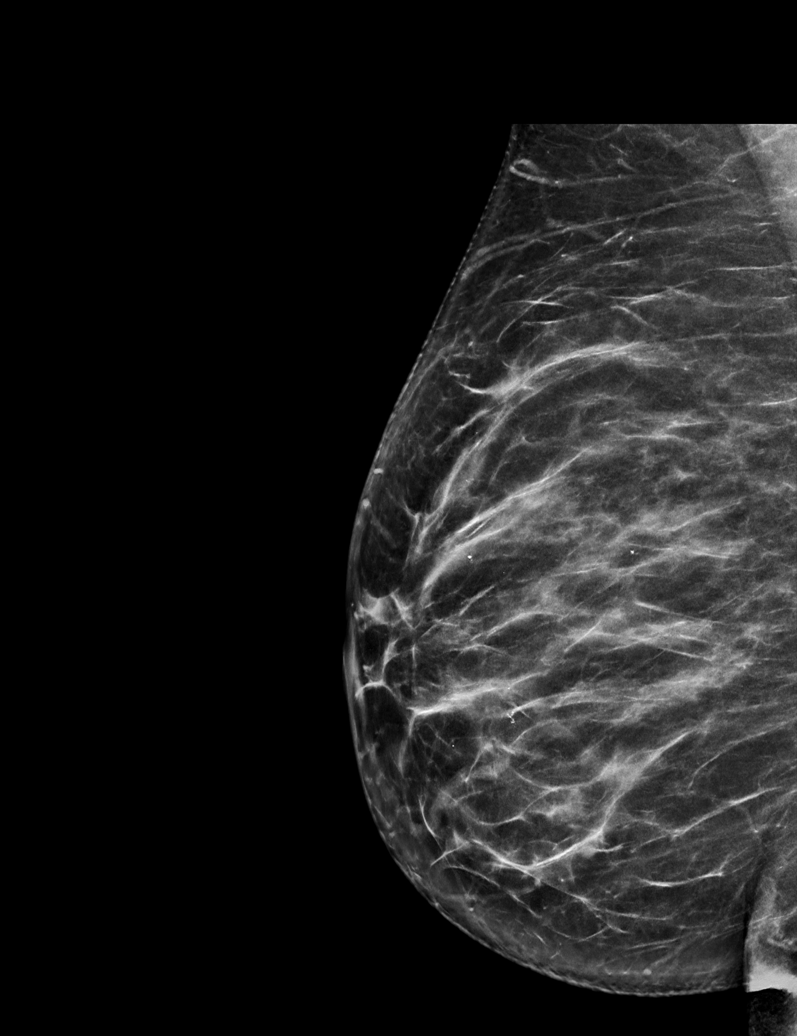

[L MLO synth-2D]
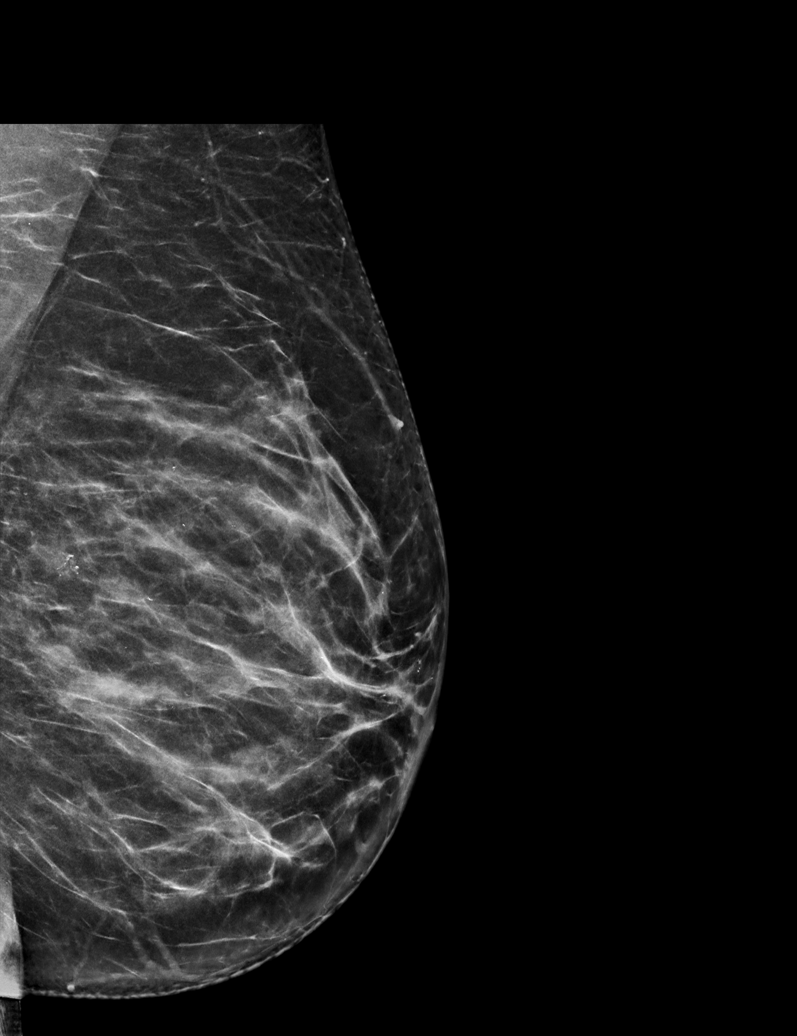

[L CC synth-2D]
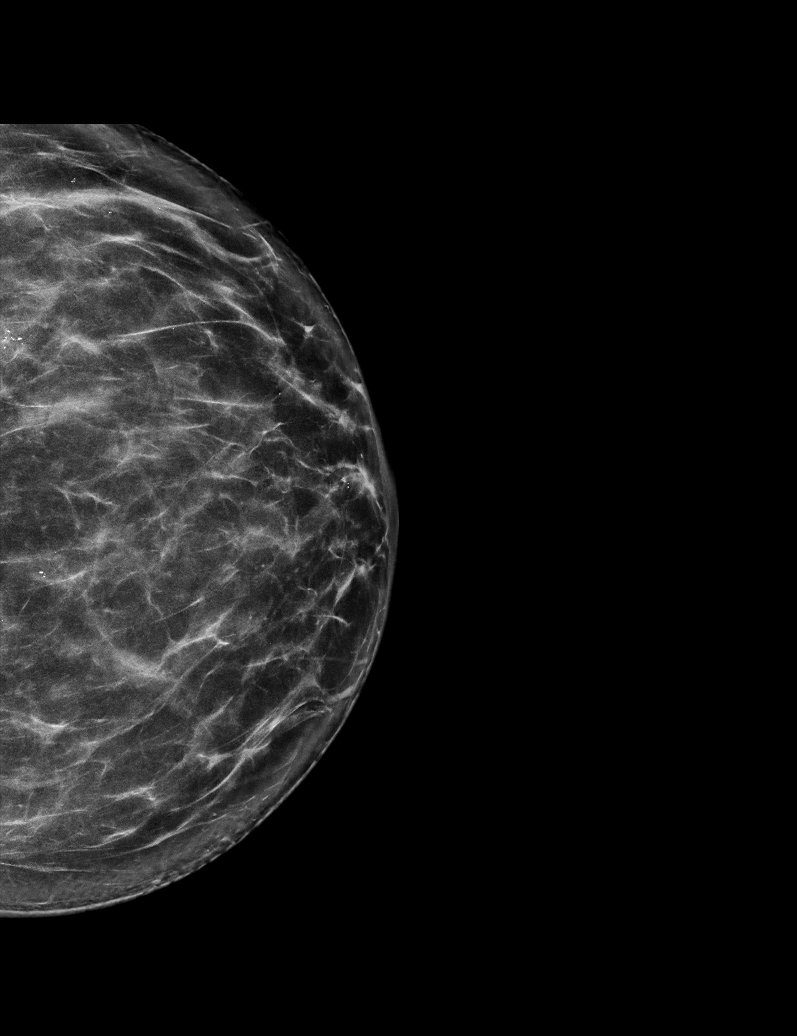

[R CC synth-2D]
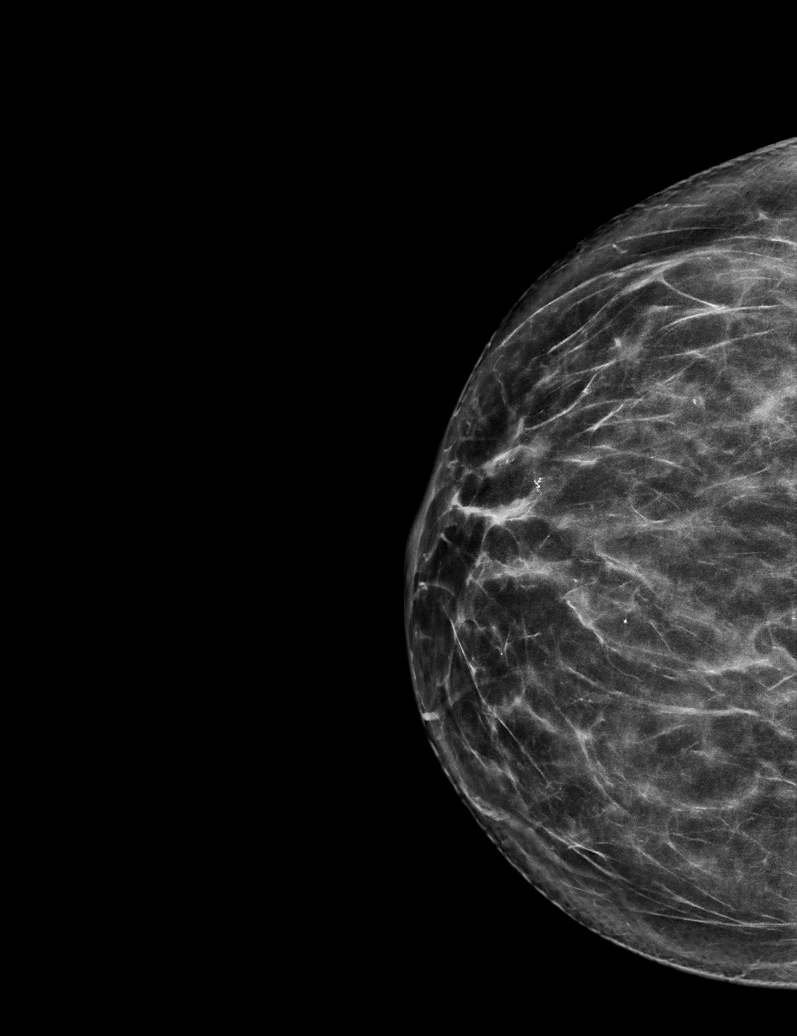

[R MLO synth-2D (2 of 2)]
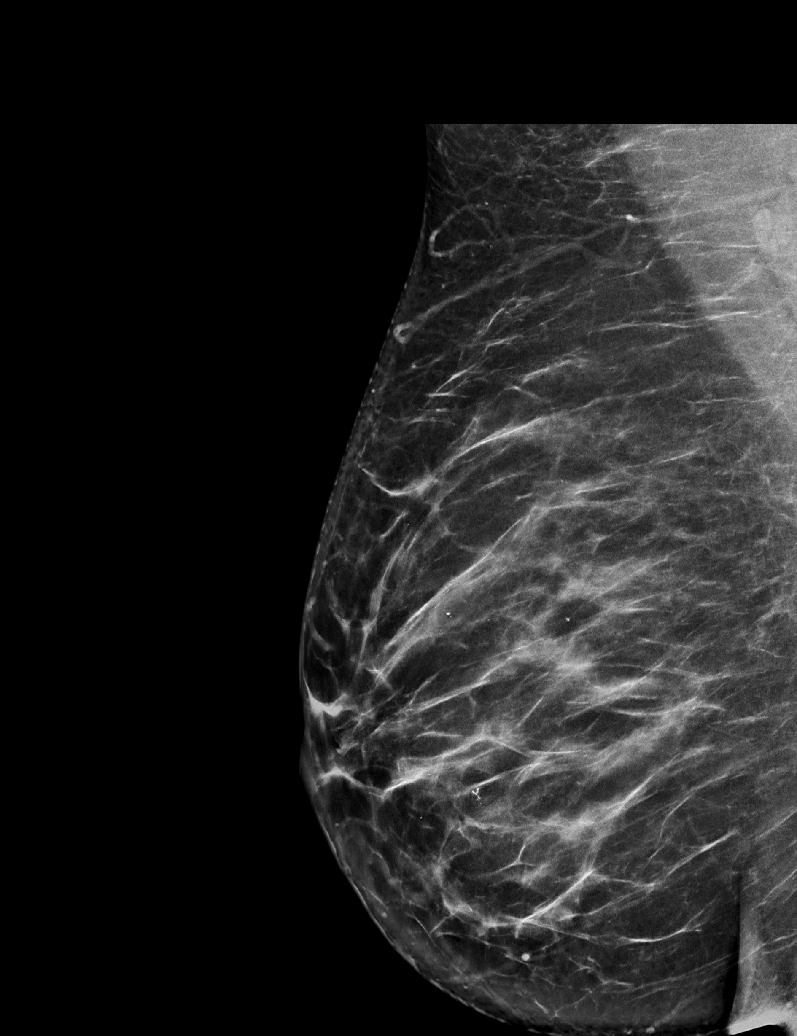

[R CC tomo · tomo slice 35/69.0]
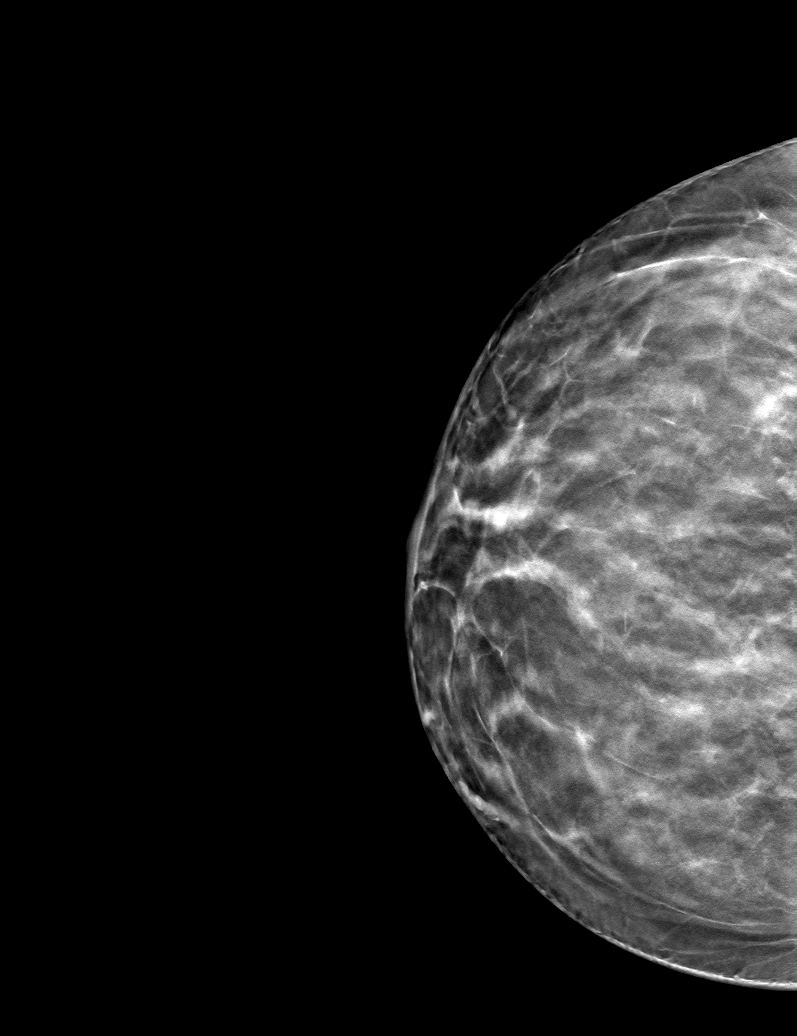

[6 of 30 positions shown; findings below may reference images not displayed]

ACR Breast Density Category c: The breast tissue is heterogeneously
dense, which may obscure small masses.
FINDINGS: In the right breast , calcifications requires further evaluation.
These calcifications are seen within the lower outer quadrant at
anterior depth.

In the left breast , calcifications and a possible asymmetry
requires further evaluation. The calcifications are seen within the
upper-outer quadrant at posterior depth. The asymmetry is seen
within the slightly lower LEFT breast, at posterior depth, MLO view
only, slice 37.
IMPRESSION: Further evaluation is suggested for calcifications in the right
breast.

Further evaluation is suggested for calcifications and a possible
asymmetry in the left breast.

RECOMMENDATION:
Diagnostic mammogram and possibly ultrasound of both breasts.
(Code:NM-K-LLJ)

The patient will be contacted regarding the findings, and additional
imaging will be scheduled.

BI-RADS CATEGORY  0: Incomplete. Need additional imaging evaluation
and/or prior mammograms for comparison.

## 2022-01-16 ENCOUNTER — Encounter: Payer: Self-pay | Admitting: Family

## 2022-01-18 ENCOUNTER — Ambulatory Visit: Payer: BC Managed Care – PPO | Admitting: Orthopaedic Surgery

## 2022-01-31 ENCOUNTER — Ambulatory Visit: Payer: BC Managed Care – PPO | Admitting: Orthopaedic Surgery

## 2022-02-09 ENCOUNTER — Encounter: Payer: Self-pay | Admitting: Orthopaedic Surgery

## 2022-02-09 ENCOUNTER — Ambulatory Visit (INDEPENDENT_AMBULATORY_CARE_PROVIDER_SITE_OTHER): Payer: BC Managed Care – PPO | Admitting: Orthopaedic Surgery

## 2022-02-09 ENCOUNTER — Ambulatory Visit (INDEPENDENT_AMBULATORY_CARE_PROVIDER_SITE_OTHER): Payer: BC Managed Care – PPO

## 2022-02-09 DIAGNOSIS — M25552 Pain in left hip: Secondary | ICD-10-CM

## 2022-02-09 MED ORDER — LIDOCAINE HCL 1 % IJ SOLN
1.0000 mL | INTRAMUSCULAR | Status: AC | PRN
  Administered 2022-02-09: 1 mL

## 2022-02-09 MED ORDER — METHYLPREDNISOLONE ACETATE 40 MG/ML IJ SUSP
40.0000 mg | INTRAMUSCULAR | Status: AC | PRN
  Administered 2022-02-09: 40 mg via INTRAMUSCULAR

## 2022-02-09 NOTE — Progress Notes (Signed)
? ?Office Visit Note ?  ?Patient: Wendy Simpson           ?Date of Birth: [birth year]           ?MRN: [id number] ?Visit Date: 02/09/2022 ?             ?Requested by: Camillia Herter, NP ?[facility] ?[address] ?[place],  [place] [address] ?PCP: Camillia Herter, NP ? ? ?Assessment & Plan: ?Visit Diagnoses:  ?1. Pain in left hip   ? ? ?Plan:  ?Recommend Voltaren gel 4 g up to 4 times daily over the ASIS region.  She tolerated the injection well.  Questions were encouraged by Dr. Ninfa Linden and myself. ? ? ?Follow-Up Instructions: Return if symptoms worsen or fail to improve.  ? ?Orders:  ?Orders Placed This Encounter  ?Procedures  ? Trigger Point Inj  ? XR HIP UNILAT W OR W/O PELVIS 2-3 VIEWS LEFT  ? ?No orders of the defined types were placed in this encounter. ? ? ? ? Procedures: ?Trigger Point Inj ? ?Date/Time: 02/09/2022 10:02 AM ?Performed by: Pete Pelt, PA-C ?Authorized by: Pete Pelt, PA-C  ? ?Consent Given by:  Patient ?Site marked: the procedure site was marked   ?Timeout: prior to procedure the correct patient, procedure, and site was verified   ?Indications:  Pain and therapeutic ?Total # of Trigger Points:  1 ?Location: lower extremity   ?Needle Size:  25 G ?Approach:  D[place]al ?Medications #1:  1 mL lidocaine 1 %; 40 mg methylPREDNISolone acetate 40 MG/ML ? ? ?Clinical Data: ?No additional findings. ? ? ?Subjective: ?Chief Complaint  ?Patient presents with  ? Left Hip - Pain  ? ? ?HPI ?Leylanie is a 46 year old female were seen for the first time for left hip pain.  She states the pain has been ongoing for the last 2 years.  She states pain comes and goes.  She notes groin pain she is pointing more towards her anterior superior iliac spine region more so than groin nodes.  She ranks her pain to be 5-6 out of 10 pain at worst.  Does note that ibuprofen helps.  States the pain is worse when she first begins to ambulate but gets better with each step.  She has had no recent injury.  Does report a  history of falling whenever she was ice-skating may have injured the hip at that time.  Denies any fevers chills.  She is nondiabetic. ? ?Review of Systems  ?Constitutional:  Negative for chills and fever.  ? ? ?Objective: ?Vital Signs: There were no vitals taken for this visit. ? ?Physical Exam ?Constitutional:   ?   Appearance: She is not ill-appearing or diaphoretic.  ?Pulmonary:  ?   Effort: Pulmonary effort is normal.  ?Neurological:  ?   Mental Status: She is alert and oriented to person, place, and time.  ?Psychiatric:     ?   Mood and Affect: Mood normal.  ? ? ?Ortho Exam ?Bilateral hips excellent range of motion without pain.  Minimal tenderness over the trochanteric region of her left hip.  Maximal tenderness over the region of the ASIS.  Ambulates without any assistive device and a nonantalgic gait. ?Specialty Comments:  ?No specialty comments available. ? ?Imaging: ?XR HIP UNILAT W OR W/O PELVIS 2-3 VIEWS LEFT ? ?Result Date: 02/09/2022 ?AP pelvis lateral view left hip: No acute fracture.  Bilateral hips well located.  Hip joints are well maintained.  Benign cystic change in the lateral  aspect of the femoral head on the left.  Otherwise no bony abnormalities.    ? ? ?PMFS History: ?Patient Active Problem List  ? Diagnosis Date Noted  ? Breast cancer ([place]) 06/15/2021  ? Hx-TIA (transient ischemic attack) 04/15/2012  ? Cholelithiasis 07/08/2008  ? Allergic rhinitis 06/19/2008  ? ASTHMA, EXERCISE INDUCED 06/19/2008  ? NEPHROLITHIASIS, HX OF 06/19/2008  ? Asthma, exercise induced 06/19/2008  ? ?Past Medical History:  ?Diagnosis Date  ? Anxiety   ? Asthma   ? exercise induced  ? Depression   ? Hypertension   ? PONV (postoperative nausea and vomiting)   ? Stroke ([facility]) 10/24/2001  ? 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  ?  ?Family History  ?Problem Relation Age of Onset  ? Cancer Mother 44  ?     L BREAST  ? Hypertension Mother 16  ? Raynaud syndrome Mother   ?     NOT SURE AGE  ? Breast cancer Mother 52  ? Hypertension  Father 7  ? Kidney disease Maternal Grandfather   ?  ?Past Surgical History:  ?Procedure Laterality Date  ? BREAST RECONSTRUCTION WITH PLACEMENT OF TISSUE EXPANDER AND FLEX HD (ACELLULAR HYDRATED DERMIS) Bilateral 06/15/2021  ? Procedure: BILATERAL BREAST RECONSTRUCTION WITH PLACEMENT OF TISSUE EXPANDERS AND FLEX HD (ACELLULAR HYDRATED DERMIS);  Surgeon: [name], MD;  Location: [facility];  Service: Plastics;  Laterality: Bilateral;  ? [place]  ? GALLBLADDER SURGERY  07/2010  ? KIDNEY STONE SURGERY  07/2011  ? NIPPLE SPARING MASTECTOMY Bilateral 06/15/2021  ? Procedure: BILATERAL NIPPLE SPARING MASTECTOMY;  Surgeon: Coralie Keens, MD;  Location: [facility];  Service: General;  Laterality: Bilateral;  ? REMOVAL OF BILATERAL TISSUE EXPANDERS WITH PLACEMENT OF BILATERAL BREAST IMPLANTS Bilateral 09/14/2021  ? Procedure: REMOVAL OF BILATERAL TISSUE EXPANDERS WITH PLACEMENT OF BILATERAL BREAST IMPLANTS;  Surgeon: [name], MD;  Location: [facility];  Service: Plastics;  Laterality: Bilateral;  1.5 hour  ? TUBAL LIGATION  2002  ? ?Social History  ? ?Occupational History  ? Not on file  ?Tobacco Use  ? Smoking status: Never  ?  Passive exposure: Never  ? Smokeless tobacco: Never  ?Vaping Use  ? Vaping Use: Never used  ?Substance and Sexual Activity  ? Alcohol use: Not on file  ? Drug use: Never  ? Sexual activity: Never  ? ? ? ? ? ? ?

## 2022-03-09 ENCOUNTER — Ambulatory Visit (INDEPENDENT_AMBULATORY_CARE_PROVIDER_SITE_OTHER): Payer: BC Managed Care – PPO | Admitting: Plastic Surgery

## 2022-03-09 DIAGNOSIS — N6459 Other signs and symptoms in breast: Secondary | ICD-10-CM

## 2022-03-09 DIAGNOSIS — Z9889 Other specified postprocedural states: Secondary | ICD-10-CM

## 2022-03-09 NOTE — Progress Notes (Signed)
Patient presents about 6 months out from bilateral mastectomies, tissue expander reconstruction, and exchange to gel implants.  She is overall happy with her reconstruction.  She notes a few areas that appear flat from the superior view in terms of the projection.  She is also bothered by some excess skin in the lateral areas on both sides.  She has lost over 20 pounds since her last surgery and plans to lose 10-20 more pounds.  On exam she has a nice symmetric result.  There is a few areas of flattening which may be due to subtle differences in the thickness of the mastectomy flaps.  There is excess skin laterally on both sides extending towards the axilla.  This is more notable than previously and I believe that is likely due to the changes in weight.  We had a discussion regarding the plan going forward.  I do think she would be a reasonable candidate for fat grafting.  When she is at her new stable weight we could certainly excise the excess skin laterally.  She is overall very happy however and does not feel motivated to pursue any revisions at this point.  She will plan to call us down the line if she wants to be reevaluated.  All of her questions were answered. ?

## 2022-03-27 NOTE — Progress Notes (Deleted)
Patient ID: Wendy Simpson, female    DOB: 1976-04-19  MRN: [id number]  CC: Hypertension Follow-Up  Subjective: Elianna Windom is a 46 y.o. female who presents for hypertension follow-up.  Her concerns today include:  Hypertension follow-up: 12/31/2021: - Losartan-Hydrochlorothiazide as prescribed.   04/01/2022:   Patient Active Problem List   Diagnosis Date Noted   Breast cancer ([place]) 06/15/2021   Hx-TIA (transient ischemic attack) 04/15/2012   Cholelithiasis 07/08/2008   Allergic rhinitis 06/19/2008   ASTHMA, EXERCISE INDUCED 06/19/2008   NEPHROLITHIASIS, HX OF 06/19/2008   Asthma, exercise induced 06/19/2008     Current Outpatient Medications on File Prior to Visit  Medication Sig Dispense Refill   losartan-hydrochlorothiazide (HYZAAR) 50-12.5 MG tablet Take 1 tablet by mouth daily. 90 tablet 0   vitamin C (ASCORBIC ACID) 250 MG tablet Take 250 mg by mouth daily.     No current facility-administered medications on file prior to visit.    Allergies  Allergen Reactions   Codeine     REACTION: Rash    Social History   Socioeconomic History   Marital status: Married    Spouse name: Not on file   Number of children: Not on file   Years of education: Not on file   Highest education level: Not on file  Occupational History   Not on file  Tobacco Use   Smoking status: Never    Passive exposure: Never   Smokeless tobacco: Never  Vaping Use   Vaping Use: Never used  Substance and Sexual Activity   Alcohol use: Not on file   Drug use: Never   Sexual activity: Never  Other Topics Concern   Not on file  Social History Narrative   Not on file   Social Determinants of Health   Financial Resource Strain: Not on file  Food Insecurity: Not on file  Transportation Needs: Not on file  Physical Activity: Not on file  Stress: Not on file  Social Connections: Not on file  Intimate Partner Violence: Not on file    Family History  Problem Relation Age of Onset    Cancer Mother 36       L BREAST   Hypertension Mother 29   Raynaud syndrome Mother        NOT SURE AGE   Breast cancer Mother 69   Hypertension Father 47   Kidney disease Maternal Grandfather     Past Surgical History:  Procedure Laterality Date   BREAST RECONSTRUCTION WITH PLACEMENT OF TISSUE EXPANDER AND FLEX HD (ACELLULAR HYDRATED DERMIS) Bilateral 06/15/2021   Procedure: BILATERAL BREAST RECONSTRUCTION WITH PLACEMENT OF TISSUE EXPANDERS AND FLEX HD (ACELLULAR HYDRATED DERMIS);  Surgeon: [name], MD;  Location: [place];  Service: Plastics;  Laterality: Bilateral;   CESAREAN SECTION  1995   GALLBLADDER SURGERY  07/2010   KIDNEY STONE SURGERY  07/2011   NIPPLE SPARING MASTECTOMY Bilateral 06/15/2021   Procedure: BILATERAL NIPPLE SPARING MASTECTOMY;  Surgeon: Coralie Keens, MD;  Location: [place];  Service: General;  Laterality: Bilateral;   REMOVAL OF BILATERAL TISSUE EXPANDERS WITH PLACEMENT OF BILATERAL BREAST IMPLANTS Bilateral 09/14/2021   Procedure: REMOVAL OF BILATERAL TISSUE EXPANDERS WITH PLACEMENT OF BILATERAL BREAST IMPLANTS;  Surgeon: [name], MD;  Location: [place];  Service: Plastics;  Laterality: Bilateral;  1.5 hour   TUBAL LIGATION  2002    ROS: Review of Systems Negative except as stated above  PHYSICAL EXAM: There were no vitals  taken for this visit.  Physical Exam  {female adult master:310786} {female adult master:310785}     Latest Ref Rng & Units 09/08/2021    7:45 AM 06/11/2021    7:15 AM 05/25/2016   12:47 AM  CMP  Glucose 70 - 99 mg/dL 91   107   102    BUN 6 - 20 mg/dL '14   12   11    '$ Creatinine 0.44 - 1.00 mg/dL 0.57   0.51   0.44    Sodium 135 - 145 mmol/L 137   136   137    Potassium 3.5 - 5.1 mmol/L 4.0   4.3   3.5    Chloride 98 - 111 mmol/L 104   106   103    CO2 22 - 32 mmol/L '24   25   24    '$ Calcium 8.9 - 10.3 mg/dL 8.9   8.7   9.0    Total Protein 6.5 - 8.1 g/dL    8.1    Total Bilirubin 0.3 - 1.2 mg/dL   0.7    Alkaline Phos 38 - 126 U/L   64    AST 15 - 41 U/L   17    ALT 14 - 54 U/L   14     Lipid Panel     Component Value Date/Time   CHOL 197 08/04/2008 1007   TRIG 82 08/04/2008 1007   HDL 39.7 08/04/2008 1007   CHOLHDL 5.0 CALC 08/04/2008 1007   VLDL 16 08/04/2008 1007   LDLCALC 141 (H) 08/04/2008 1007    CBC    Component Value Date/Time   WBC 11.9 (H) 05/25/2016 0047   RBC 4.93 05/25/2016 0047   HGB 14.2 05/25/2016 0047   HCT 43.4 05/25/2016 0047   PLT 269 05/25/2016 0047   MCV 88.2 05/25/2016 0047   MCH 28.9 05/25/2016 0047   MCHC 32.7 05/25/2016 0047   RDW 14.3 05/25/2016 0047   LYMPHSABS 2.9 04/12/2012 1439   MONOABS 0.5 04/12/2012 1439   EOSABS 0.1 04/12/2012 1439   BASOSABS 0.0 04/12/2012 1439    ASSESSMENT AND PLAN:  There are no diagnoses linked to this encounter.   Patient was given the opportunity to ask questions.  Patient verbalized understanding of the plan and was able to repeat key elements of the plan. Patient was given clear instructions to go to Emergency Department or return to medical center if symptoms don't improve, worsen, or new problems develop.The patient verbalized understanding.   No orders of the defined types were placed in this encounter.    Requested Prescriptions    No prescriptions requested or ordered in this encounter    No follow-ups on file.  Camillia Herter, NP

## 2022-03-31 DIAGNOSIS — R519 Headache, unspecified: Secondary | ICD-10-CM | POA: Diagnosis not present

## 2022-03-31 DIAGNOSIS — H04123 Dry eye syndrome of bilateral lacrimal glands: Secondary | ICD-10-CM | POA: Diagnosis not present

## 2022-03-31 LAB — HEMOGLOBIN A1C: Hemoglobin A1C: 5.6

## 2022-03-31 LAB — CBC AND DIFFERENTIAL
HCT: 39 (ref 36–46)
Hemoglobin: 13.3 (ref 12.0–16.0)
Platelets: 299 10*3/uL (ref 150–400)
WBC: 7.8

## 2022-03-31 LAB — BASIC METABOLIC PANEL
BUN: 12 (ref 4–21)
CO2: 23 — AB (ref 13–22)
Chloride: 103 (ref 99–108)
Creatinine: 0.6 (ref 0.5–1.1)
Glucose: 98
Potassium: 4.3 mEq/L (ref 3.5–5.1)
Sodium: 141 (ref 137–147)

## 2022-03-31 LAB — CBC: RBC: 4.34 (ref 3.87–5.11)

## 2022-03-31 LAB — HEPATIC FUNCTION PANEL
ALT: 19 U/L (ref 7–35)
AST: 16 (ref 13–35)
Alkaline Phosphatase: 66 (ref 25–125)
Bilirubin, Total: 0.3

## 2022-03-31 LAB — COMPREHENSIVE METABOLIC PANEL
Albumin: 4.1 (ref 3.5–5.0)
Calcium: 9.2 (ref 8.7–10.7)
Globulin: 2.3
eGFR: 112

## 2022-04-01 ENCOUNTER — Ambulatory Visit: Payer: BC Managed Care – PPO | Admitting: Family

## 2022-04-01 DIAGNOSIS — I10 Essential (primary) hypertension: Secondary | ICD-10-CM

## 2022-04-05 NOTE — Progress Notes (Unsigned)
Patient ID: AHUVA POYNOR, female    DOB: 01/24/1976  MRN: [id number]  CC: Hypertension Follow-Up  Subjective: Wendy Simpson is a 46 y.o. female who presents for hypertension follow-up.  Her concerns today include:  Hypertension follow-up: 12/31/2021: - Losartan-Hydrochlorothiazide as prescribed.   04/06/2022:  Patient Active Problem List   Diagnosis Date Noted   Breast cancer ([place]) 06/15/2021   Hx-TIA (transient ischemic attack) 04/15/2012   Cholelithiasis 07/08/2008   Allergic rhinitis 06/19/2008   ASTHMA, EXERCISE INDUCED 06/19/2008   NEPHROLITHIASIS, HX OF 06/19/2008   Asthma, exercise induced 06/19/2008     Current Outpatient Medications on File Prior to Visit  Medication Sig Dispense Refill   losartan-hydrochlorothiazide (HYZAAR) 50-12.5 MG tablet Take 1 tablet by mouth daily. 90 tablet 0   vitamin C (ASCORBIC ACID) 250 MG tablet Take 250 mg by mouth daily.     No current facility-administered medications on file prior to visit.    Allergies  Allergen Reactions   Codeine     REACTION: Rash    Social History   Socioeconomic History   Marital status: Married    Spouse name: Not on file   Number of children: Not on file   Years of education: Not on file   Highest education level: Not on file  Occupational History   Not on file  Tobacco Use   Smoking status: Never    Passive exposure: Never   Smokeless tobacco: Never  Vaping Use   Vaping Use: Never used  Substance and Sexual Activity   Alcohol use: Not on file   Drug use: Never   Sexual activity: Never  Other Topics Concern   Not on file  Social History Narrative   Not on file   Social Determinants of Health   Financial Resource Strain: Not on file  Food Insecurity: Not on file  Transportation Needs: Not on file  Physical Activity: Not on file  Stress: Not on file  Social Connections: Not on file  Intimate Partner Violence: Not on file    Family History  Problem Relation Age of Onset    Cancer Mother 88       L BREAST   Hypertension Mother 72   Raynaud syndrome Mother        NOT SURE AGE   Breast cancer Mother 42   Hypertension Father 81   Kidney disease Maternal Grandfather     Past Surgical History:  Procedure Laterality Date   BREAST RECONSTRUCTION WITH PLACEMENT OF TISSUE EXPANDER AND FLEX HD (ACELLULAR HYDRATED DERMIS) Bilateral 06/15/2021   Procedure: BILATERAL BREAST RECONSTRUCTION WITH PLACEMENT OF TISSUE EXPANDERS AND FLEX HD (ACELLULAR HYDRATED DERMIS);  Surgeon: [name], MD;  Location: [place];  Service: Plastics;  Laterality: Bilateral;   CESAREAN SECTION  1995   GALLBLADDER SURGERY  07/2010   KIDNEY STONE SURGERY  07/2011   NIPPLE SPARING MASTECTOMY Bilateral 06/15/2021   Procedure: BILATERAL NIPPLE SPARING MASTECTOMY;  Surgeon: Coralie Keens, MD;  Location: [place];  Service: General;  Laterality: Bilateral;   REMOVAL OF BILATERAL TISSUE EXPANDERS WITH PLACEMENT OF BILATERAL BREAST IMPLANTS Bilateral 09/14/2021   Procedure: REMOVAL OF BILATERAL TISSUE EXPANDERS WITH PLACEMENT OF BILATERAL BREAST IMPLANTS;  Surgeon: [name], MD;  Location: [place];  Service: Plastics;  Laterality: Bilateral;  1.5 hour   TUBAL LIGATION  2002    ROS: Review of Systems Negative except as stated above  PHYSICAL EXAM: There were no vitals taken  for this visit.  Physical Exam  {female adult master:310786} {female adult master:310785}     Latest Ref Rng & Units 09/08/2021    7:45 AM 06/11/2021    7:15 AM 05/25/2016   12:47 AM  CMP  Glucose 70 - 99 mg/dL 91  107  102   BUN 6 - 20 mg/dL '14  12  11   '$ Creatinine 0.44 - 1.00 mg/dL 0.57  0.51  0.44   Sodium 135 - 145 mmol/L 137  136  137   Potassium 3.5 - 5.1 mmol/L 4.0  4.3  3.5   Chloride 98 - 111 mmol/L 104  106  103   CO2 22 - 32 mmol/L '24  25  24   '$ Calcium 8.9 - 10.3 mg/dL 8.9  8.7  9.0   Total Protein 6.5 - 8.1 g/dL   8.1   Total Bilirubin  0.3 - 1.2 mg/dL   0.7   Alkaline Phos 38 - 126 U/L   64   AST 15 - 41 U/L   17   ALT 14 - 54 U/L   14    Lipid Panel     Component Value Date/Time   CHOL 197 08/04/2008 1007   TRIG 82 08/04/2008 1007   HDL 39.7 08/04/2008 1007   CHOLHDL 5.0 CALC 08/04/2008 1007   VLDL 16 08/04/2008 1007   LDLCALC 141 (H) 08/04/2008 1007    CBC    Component Value Date/Time   WBC 11.9 (H) 05/25/2016 0047   RBC 4.93 05/25/2016 0047   HGB 14.2 05/25/2016 0047   HCT 43.4 05/25/2016 0047   PLT 269 05/25/2016 0047   MCV 88.2 05/25/2016 0047   MCH 28.9 05/25/2016 0047   MCHC 32.7 05/25/2016 0047   RDW 14.3 05/25/2016 0047   LYMPHSABS 2.9 04/12/2012 1439   MONOABS 0.5 04/12/2012 1439   EOSABS 0.1 04/12/2012 1439   BASOSABS 0.0 04/12/2012 1439    ASSESSMENT AND PLAN:  There are no diagnoses linked to this encounter.   Patient was given the opportunity to ask questions.  Patient verbalized understanding of the plan and was able to repeat key elements of the plan. Patient was given clear instructions to go to Emergency Department or return to medical center if symptoms don't improve, worsen, or new problems develop.The patient verbalized understanding.   No orders of the defined types were placed in this encounter.    Requested Prescriptions    No prescriptions requested or ordered in this encounter    No follow-ups on file.  Camillia Herter, NP

## 2022-04-06 ENCOUNTER — Ambulatory Visit: Payer: BC Managed Care – PPO | Admitting: Plastic Surgery

## 2022-04-06 ENCOUNTER — Encounter: Payer: BC Managed Care – PPO | Admitting: Family

## 2022-04-06 DIAGNOSIS — I10 Essential (primary) hypertension: Secondary | ICD-10-CM

## 2022-04-20 ENCOUNTER — Encounter: Payer: BC Managed Care – PPO | Admitting: Obstetrics & Gynecology

## 2022-05-04 ENCOUNTER — Ambulatory Visit (INDEPENDENT_AMBULATORY_CARE_PROVIDER_SITE_OTHER)
Admission: RE | Admit: 2022-05-04 | Discharge: 2022-05-04 | Disposition: A | Discharge status: Home / Self Care | Payer: BC Managed Care – PPO | Source: Ambulatory Visit | Attending: Nurse Practitioner | Admitting: Nurse Practitioner

## 2022-05-04 ENCOUNTER — Encounter: Payer: Self-pay | Admitting: Nurse Practitioner

## 2022-05-04 ENCOUNTER — Ambulatory Visit (INDEPENDENT_AMBULATORY_CARE_PROVIDER_SITE_OTHER): Payer: BC Managed Care – PPO | Admitting: Nurse Practitioner

## 2022-05-04 VITALS — BP 118/90 | HR 75 | Temp 97.8°F | Ht 64.0 in | Wt 164.0 lb

## 2022-05-04 DIAGNOSIS — Z0000 Encounter for general adult medical examination without abnormal findings: Secondary | ICD-10-CM | POA: Insufficient documentation

## 2022-05-04 DIAGNOSIS — M19042 Primary osteoarthritis, left hand: Secondary | ICD-10-CM | POA: Diagnosis not present

## 2022-05-04 DIAGNOSIS — M79642 Pain in left hand: Secondary | ICD-10-CM | POA: Insufficient documentation

## 2022-05-04 DIAGNOSIS — Z853 Personal history of malignant neoplasm of breast: Secondary | ICD-10-CM | POA: Diagnosis not present

## 2022-05-04 DIAGNOSIS — Z8673 Personal history of transient ischemic attack (TIA), and cerebral infarction without residual deficits: Secondary | ICD-10-CM | POA: Diagnosis not present

## 2022-05-04 DIAGNOSIS — F411 Generalized anxiety disorder: Secondary | ICD-10-CM | POA: Insufficient documentation

## 2022-05-04 DIAGNOSIS — M79641 Pain in right hand: Secondary | ICD-10-CM | POA: Insufficient documentation

## 2022-05-04 DIAGNOSIS — Z1211 Encounter for screening for malignant neoplasm of colon: Secondary | ICD-10-CM

## 2022-05-04 DIAGNOSIS — M25741 Osteophyte, right hand: Secondary | ICD-10-CM | POA: Diagnosis not present

## 2022-05-04 DIAGNOSIS — R5383 Other fatigue: Secondary | ICD-10-CM | POA: Diagnosis not present

## 2022-05-04 DIAGNOSIS — M25742 Osteophyte, left hand: Secondary | ICD-10-CM | POA: Diagnosis not present

## 2022-05-04 MED ORDER — BUSPIRONE HCL 5 MG PO TABS: 5.0000 mg | ORAL_TABLET | Freq: Two times a day (BID) | ORAL | 1 refills | Status: DC

## 2022-05-04 NOTE — Progress Notes (Signed)
New Patient Office Visit  Subjective    Patient ID: Wendy Simpson, female    DOB: 04-Jun-1976  Age: 46 y.o. MRN: [id number]  CC:  Chief Complaint  Patient presents with   Establish Care    Has had a PAP since 2009 but it was in [facility]. Pt is establishing with a new OBGYN in August.    Joint Pain    Mostly in fingers. Swell when its cold out and hurt when its cold out.    HPI Wendy Simpson presents to establish care  States that she is tired most morning. Takes a lot of energy. States that today she feels fine Hot flashes: once a night. States it feels like "1000 degrees:" states that she sweats likea 400 pound    HTN: will check it at work if she feels off. She is on Hyzaar  Bilateral hand pain: Patient states started out with bilateral thumbs approximately 3 months ago and is increasing.  Now it feels like her fingers are involved described as a "tight feeling" also described as "need to crack her knuckles" but when she does does not work.  She did have lab work drawn inclusive of ANA, CRP, ESR, RF.  All blood work came back negative. for complete physical and follow up of chronic conditions.  Immunizations: -Tetanus:2020 -Influenza: out of season -Covid-19: pfizer x 2 -Shingles: Too young -Pneumonia: Too young  -HPV: Aged out  Diet: Fair diet. Eats 3 meals a day.not a big snacker. Mostly water. Once Diet on occasion  Exercise: No regular exercise.   Eye exam: Completes annually [facility] [place] exam: PRN saw one last year   Pap Smear:  Needs updating. [place] [facility] Mammogram: Scheduled for one in august  with General surgeon  Colonoscopy: [place] will be the preferred location, ambulatory referral placed Lung Cancer Screening: N/A  Dexa: N/A  Sleep: in bed around 9 and sleep around 10 and gets up around 545. Does not snore. Does not feel rested.    Outpatient Encounter Medications as of 05/04/2022  Medication Sig    busPIRone (BUSPAR) 5 MG tablet Take 1 tablet (5 mg total) by mouth 2 (two) times daily.   losartan-hydrochlorothiazide (HYZAAR) 50-12.5 MG tablet Take 1 tablet by mouth daily.   [DISCONTINUED] vitamin C (ASCORBIC ACID) 250 MG tablet Take 250 mg by mouth daily.   No facility-administered encounter medications on file as of 05/04/2022.    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    Past Surgical History:  Procedure Laterality Date   BREAST RECONSTRUCTION WITH PLACEMENT OF TISSUE EXPANDER AND FLEX HD (ACELLULAR HYDRATED DERMIS) Bilateral 06/15/2021   Procedure: BILATERAL BREAST RECONSTRUCTION WITH PLACEMENT OF TISSUE EXPANDERS AND FLEX HD (ACELLULAR HYDRATED DERMIS);  Surgeon: [name], MD;  Location: [place];  Service: Plastics;  Laterality: Bilateral;   [place]  07/2010   KIDNEY STONE SURGERY  07/2011   NIPPLE SPARING MASTECTOMY Bilateral 06/15/2021   Procedure: BILATERAL NIPPLE SPARING MASTECTOMY;  Surgeon: Coralie Keens, MD;  Location: [place];  Service: General;  Laterality: Bilateral;   REMOVAL OF BILATERAL TISSUE EXPANDERS WITH PLACEMENT OF BILATERAL BREAST IMPLANTS Bilateral 09/14/2021  Procedure: REMOVAL OF BILATERAL TISSUE EXPANDERS WITH PLACEMENT OF BILATERAL BREAST IMPLANTS;  Surgeon: [name], MD;  Location: [place];  Service: Plastics;  Laterality: Bilateral;  1.5 hour   TUBAL LIGATION  2002    Family History  Problem Relation Age of Onset   Cancer Mother 59       L BREAST   Hypertension Mother 9   Raynaud syndrome Mother        NOT SURE AGE   Breast cancer Mother 69   Hypertension Father 31   Kidney disease Maternal Grandfather     Social History   Socioeconomic History   Marital status:  Married    Spouse name: Not on file   Number of children: 2   Years of education: Not on file   Highest education level: High school graduate  Occupational History   Not on file  Tobacco Use   Smoking status: Never    Passive exposure: Never   Smokeless tobacco: Never  Vaping Use   Vaping Use: Never used  Substance and Sexual Activity   Alcohol use: Yes    Comment: socailly   Drug use: Never   Sexual activity: Never  Other Topics Concern   Not on file  Social History Narrative   Fulltime: [employer]   Social Determinants of Health   Financial Resource Strain: Not on file  Food Insecurity: Not on file  Transportation Needs: Not on file  Physical Activity: Not on file  Stress: Not on file  Social Connections: Not on file  Intimate Partner Violence: Not on file    Review of Systems  Constitutional:  Positive for malaise/fatigue. Negative for chills and fever.  Respiratory:  Negative for shortness of breath.   Cardiovascular:  Negative for chest pain and leg swelling.  Gastrointestinal:  Negative for diarrhea, nausea and vomiting.       BM 2 times a week  Genitourinary:  Negative for dysuria and hematuria.       LMP 2-3 weeks ago   Neurological:  Positive for headaches. Negative for dizziness and tingling.  Psychiatric/Behavioral:  Negative for hallucinations and suicidal ideas.         Objective    BP 118/90   Pulse 75   Temp 97.8 F (36.6 C) (Temporal)   Ht 5' 4"  (1.626 m)   Wt 164 lb (74.4 kg)   LMP 04/12/2022 (Approximate)   SpO2 97%   BMI 28.15 kg/m   Physical Exam      Assessment & Plan:   Problem List Items Addressed This Visit       Other   History of TIA (transient ischemic attack)    Historical diagnosis keep a check on blood pressure pending labs today inclusive of lipid panel      Relevant Orders   Lipid panel   Preventative health care - Primary    Discussed age-appropriate immunizations and screening exams.       Relevant Orders   Lipid panel   Bilateral hand pain    Ambiguous in nature.  Patient's had ESR, CRP, ANA, and RF labs drawn all negative.  Pending bilateral pictures in office today      Relevant Orders   DG Hand 2 View Left   DG Hand 2 View Right   History of breast cancer    Status post bilateral mastectomy with reconstruction.  Is following with general surgery coming in August for imaging of ultrasounds for bilateral breast  Other fatigue    Patient is having overt fatigue.  Basic labs drawn from her employer that were normal and are negative.  We will check on TSH, B12.  PHQ-9 and GAD-7 both positive in office patient thinks is more to do with anxiety      Relevant Orders   Vitamin B12   TSH   GAD (generalized anxiety disorder)    PHQ-9 and GAD-7 administered in office.  Both positive.  Patient is more anxiety versus depression symptoms.  We will elect to treat with BuSpar 5 mg twice daily.  Patient follow-up in approximately 4 weeks for recheck patient denies HI/SI/AVH      Relevant Medications   busPIRone (BUSPAR) 5 MG tablet   Other Visit Diagnoses     Screening for colon cancer       Relevant Orders   Ambulatory referral to Gastroenterology       Return in about 4 weeks (around 06/01/2022) for GAD recheck .   Romilda Garret, NP

## 2022-05-04 NOTE — Assessment & Plan Note (Signed)
Status post bilateral mastectomy with reconstruction.  Is following with general surgery coming in August for imaging of ultrasounds for bilateral breast

## 2022-05-04 NOTE — Assessment & Plan Note (Signed)
Discussed age-appropriate immunizations and screening exams.

## 2022-05-04 NOTE — Assessment & Plan Note (Signed)
Patient is having overt fatigue.  Basic labs drawn from her employer that were normal and are negative.  We will check on TSH, B12.  PHQ-9 and GAD-7 both positive in office patient thinks is more to do with anxiety

## 2022-05-04 NOTE — Assessment & Plan Note (Signed)
Ambiguous in nature.  Patient's had ESR, CRP, ANA, and RF labs drawn all negative.  Pending bilateral pictures in office today

## 2022-05-04 NOTE — Patient Instructions (Addendum)
Nice to see you today I will be in touch with the labs and xray results Follow up with me in 4-6 weeks to see how the new medication is working

## 2022-05-04 NOTE — Assessment & Plan Note (Signed)
Historical diagnosis keep a check on blood pressure pending labs today inclusive of lipid panel

## 2022-05-04 NOTE — Assessment & Plan Note (Signed)
PHQ-9 and GAD-7 administered in office.  Both positive.  Patient is more anxiety versus depression symptoms.  We will elect to treat with BuSpar 5 mg twice daily.  Patient follow-up in approximately 4 weeks for recheck patient denies HI/SI/AVH

## 2022-05-05 ENCOUNTER — Other Ambulatory Visit: Payer: Self-pay

## 2022-05-05 DIAGNOSIS — Z1211 Encounter for screening for malignant neoplasm of colon: Secondary | ICD-10-CM

## 2022-05-05 LAB — LIPID PANEL
Cholesterol: 230 mg/dL — ABNORMAL HIGH (ref 0–200)
HDL: 58.4 mg/dL (ref >39.00)
LDL Cholesterol: 147 mg/dL — ABNORMAL HIGH (ref 0–99)
NonHDL: 171.4
Total CHOL/HDL Ratio: 4
Triglycerides: 120 mg/dL (ref 0.0–149.0)
VLDL: 24 mg/dL (ref 0.0–40.0)

## 2022-05-05 LAB — TSH: TSH: 2.81 u[IU]/mL (ref 0.35–5.50)

## 2022-05-05 LAB — VITAMIN B12: Vitamin B-12: 521 pg/mL (ref 211–911)

## 2022-05-05 MED ORDER — PEG 3350-KCL-NA BICARB-NACL 420 G PO SOLR
4000.0000 mL | Freq: Once | ORAL | 0 refills | Status: AC
Start: 2022-05-05 — End: 2022-05-05

## 2022-05-05 NOTE — Progress Notes (Signed)
Gastroenterology Pre-Procedure Review  Request Date: 06/24/2022 Requesting Physician: Dr. Marius Ditch  PATIENT REVIEW QUESTIONS: The patient responded to the following health history questions as indicated:    1. Are you having any GI issues? no 2. Do you have a personal history of Polyps? no 3. Do you have a family history of Colon Cancer or Polyps? no 4. Diabetes Mellitus? no 5. Joint replacements in the past 12 months?no 6. Major health problems in the past 3 months?no 7. Any artificial heart valves, MVP, or defibrillator?no    MEDICATIONS & ALLERGIES:    Patient reports the following regarding taking any anticoagulation/antiplatelet therapy:   Plavix, Coumadin, Eliquis, Xarelto, Lovenox, Pradaxa, Brilinta, or Effient? no Aspirin? no  Patient confirms/reports the following medications:  Current Outpatient Medications  Medication Sig Dispense Refill   busPIRone (BUSPAR) 5 MG tablet Take 1 tablet (5 mg total) by mouth 2 (two) times daily. 60 tablet 1   losartan-hydrochlorothiazide (HYZAAR) 50-12.5 MG tablet Take 1 tablet by mouth daily. 90 tablet 0   No current facility-administered medications for this visit.    Patient confirms/reports the following allergies:  Allergies  Allergen Reactions   Codeine     REACTION: Rash    No orders of the defined types were placed in this encounter.   AUTHORIZATION INFORMATION Primary Insurance: 1D#: Group #:  Secondary Insurance: 1D#: Group #:  SCHEDULE INFORMATION: Date: 06/24/2022 Time: Location:[facility]

## 2022-05-26 ENCOUNTER — Ambulatory Visit (INDEPENDENT_AMBULATORY_CARE_PROVIDER_SITE_OTHER): Payer: BC Managed Care – PPO | Admitting: Obstetrics and Gynecology

## 2022-05-26 ENCOUNTER — Encounter: Payer: Self-pay | Admitting: Obstetrics and Gynecology

## 2022-05-26 ENCOUNTER — Other Ambulatory Visit (HOSPITAL_COMMUNITY)
Admission: RE | Admit: 2022-05-26 | Discharge: 2022-05-26 | Disposition: A | Discharge status: Home / Self Care | Payer: BC Managed Care – PPO | Source: Ambulatory Visit | Attending: Obstetrics and Gynecology | Admitting: Obstetrics and Gynecology

## 2022-05-26 VITALS — BP 126/82 | HR 89 | Ht 64.0 in | Wt 165.0 lb

## 2022-05-26 DIAGNOSIS — N951 Menopausal and female climacteric states: Secondary | ICD-10-CM | POA: Diagnosis not present

## 2022-05-26 DIAGNOSIS — Z124 Encounter for screening for malignant neoplasm of cervix: Secondary | ICD-10-CM

## 2022-05-26 DIAGNOSIS — Z01419 Encounter for gynecological examination (general) (routine) without abnormal findings: Secondary | ICD-10-CM

## 2022-05-26 DIAGNOSIS — N898 Other specified noninflammatory disorders of vagina: Secondary | ICD-10-CM | POA: Insufficient documentation

## 2022-05-26 DIAGNOSIS — R61 Generalized hyperhidrosis: Secondary | ICD-10-CM

## 2022-05-26 NOTE — Progress Notes (Signed)
Obstetrics and Gynecology New Patient Evaluation  Appointment Date: 05/26/2022  OBGYN Clinic: Center for [facility]  Primary Care Provider: Michela Pitcher  Referring Provider: Camillia Herter, NP  Chief Complaint:  Chief Complaint  Patient presents with   Establish Care    History of Present Illness: Wendy Simpson is a 46 y.o. [phone] (Patient's last menstrual period was 05/12/2022 (approximate).), seen for the above chief complaint. Her past medical history is significant for history of breast cancer, TIA  Having night sweats and vaginal dryness.   Review of Systems: Pertinent items noted in HPI and remainder of comprehensive ROS otherwise negative.    Patient Active Problem List   Diagnosis Date Noted   Menopausal and female climacteric states 05/26/2022   Vaginal dryness 05/26/2022   Preventative health care 05/04/2022   Bilateral hand pain 05/04/2022   History of breast cancer 05/04/2022   Other fatigue 05/04/2022   GAD (generalized anxiety disorder) 05/04/2022   Breast cancer ([place]) 06/15/2021   History of TIA (transient ischemic attack) 04/15/2012   Cholelithiasis 07/08/2008   Allergic rhinitis 06/19/2008   ASTHMA, EXERCISE INDUCED 06/19/2008   NEPHROLITHIASIS, HX OF 06/19/2008   Asthma, exercise induced 06/19/2008    Past Medical History:  Past Medical History:  Diagnosis Date   Anxiety    Asthma    exercise induced   Depression    Hypertension    PONV (postoperative nausea and vomiting)    Stroke ([place]) 10/24/2001   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    Past Surgical History:  Past Surgical History:  Procedure Laterality Date   BREAST RECONSTRUCTION WITH PLACEMENT OF TISSUE EXPANDER AND FLEX HD (ACELLULAR HYDRATED DERMIS) Bilateral 06/15/2021   Procedure: BILATERAL BREAST RECONSTRUCTION WITH PLACEMENT OF TISSUE EXPANDERS AND FLEX HD (ACELLULAR HYDRATED DERMIS);  Surgeon: [name], MD;  Location: [facility];  Service:  Plastics;  Laterality: Bilateral;   CESAREAN SECTION  1995   GALLBLADDER SURGERY  07/2010   KIDNEY STONE SURGERY  07/2011   NIPPLE SPARING MASTECTOMY Bilateral 06/15/2021   Procedure: BILATERAL NIPPLE SPARING MASTECTOMY;  Surgeon: Coralie Keens, MD;  Location: [facility];  Service: General;  Laterality: Bilateral;   REMOVAL OF BILATERAL TISSUE EXPANDERS WITH PLACEMENT OF BILATERAL BREAST IMPLANTS Bilateral 09/14/2021   Procedure: REMOVAL OF BILATERAL TISSUE EXPANDERS WITH PLACEMENT OF BILATERAL BREAST IMPLANTS;  Surgeon: [name], MD;  Location: [facility];  Service: Plastics;  Laterality: Bilateral;  1.5 hour   TUBAL LIGATION  2002    Past Obstetrical History:  OB History  Gravida Para Term Preterm AB Living  '2 2 2     2  '$ SAB IAB Ectopic Multiple Live Births          2    # Outcome Date GA Lbr Len/2nd Weight Sex Delivery Anes PTL Lv  2 Term 09/03/94     CS-LTranv   LIV  1 Term 04/26/90     Vag-Spont   LIV    Past Gynecological History: As per HPI. Periods: qmonth, regular, sometimes can be light or somewhat heavy, <7days History of Pap Smear(s): unknown She is currently using bilateral tubal ligation for contraception.   Social History:  Social History   Socioeconomic History   Marital status: Married    Spouse name: Not on file   Number of children: 2   Years of education: Not on file   Highest education level: High school graduate  Occupational History  Not on file  Tobacco Use   Smoking status: Never    Passive exposure: Never   Smokeless tobacco: Never  Vaping Use   Vaping Use: Never used  Substance and Sexual Activity   Alcohol use: Yes    Comment: socailly   Drug use: Never   Sexual activity: Yes    Birth control/protection: Surgical  Other Topics Concern   Not on file  Social History Narrative   Fulltime: [employer]   Social Determinants of Health   Financial Resource Strain: Not on file  Food  Insecurity: Not on file  Transportation Needs: Not on file  Physical Activity: Not on file  Stress: Not on file  Social Connections: Not on file  Intimate Partner Violence: Not on file    Family History:  Family History  Problem Relation Age of Onset   Cancer Mother 9       L BREAST   Hypertension Mother 26   Raynaud syndrome Mother        NOT SURE AGE   Breast cancer Mother 70   Hypertension Father 63   Kidney disease Maternal Grandfather     Medications Suly D. Spahr had no medications administered during this visit. Current Outpatient Medications  Medication Sig Dispense Refill   busPIRone (BUSPAR) 5 MG tablet Take 1 tablet (5 mg total) by mouth 2 (two) times daily. 60 tablet 1   losartan-hydrochlorothiazide (HYZAAR) 50-12.5 MG tablet Take 1 tablet by mouth daily. 90 tablet 0   No current facility-administered medications for this visit.    Allergies Codeine   Physical Exam:  BP 126/82   Pulse 89   Ht '5\' 4"'$  (1.626 m)   Wt 165 lb (74.8 kg)   LMP 05/12/2022 (Approximate)   BMI 28.32 kg/m  Body mass index is 28.32 kg/m. General appearance: Well nourished, well developed female in no acute distress.  Neck:  Supple, normal appearance, and no thyromegaly  Cardiovascular: normal s1 and s2.  No murmurs, rubs or gallops. Respiratory:  Clear to auscultation bilateral. Normal respiratory effort Abdomen: positive bowel sounds and no masses, hernias; diffusely non tender to palpation, non distended Neuro/Psych:  Normal mood and affect.  Skin:  Warm and dry.  Lymphatic:  No inguinal lymphadenopathy.   Pelvic exam: is not limited by body habitus EGBUS: within normal limits Vagina: no blood. +white clumpy d/c in vault Cervix: normal appearing cervix without tenderness, discharge or lesions. Uterus:  nonenlarged and non tender Adnexa:  normal adnexa and no mass, fullness, tenderness Rectovaginal: deferred  Laboratory: none  Radiology: none  Assessment: pt doing  well  Plan:  1. Cervical cancer screening - Cytology - PAP  2. Vaginal discharge ?yeast infection which could be cause of yeast infection - Cervicovaginal ancillary only( [place])  3. Menopausal and female climacteric states Pt not interested in anything, medication wise. Behavioral and OTC options d/w her  4. Night sweats  5. Vaginal dryness See above. OTC lubricants advised  RTC PRN  Durene Romans MD Attending Center for [facility])

## 2022-05-26 NOTE — Progress Notes (Signed)
Last pap was 6-7 years ago

## 2022-05-27 LAB — CERVICOVAGINAL ANCILLARY ONLY
Bacterial Vaginitis (gardnerella): NEGATIVE
Candida Glabrata: NEGATIVE
Candida Vaginitis: POSITIVE — AB
Comment: NEGATIVE
Comment: NEGATIVE
Comment: NEGATIVE

## 2022-05-27 LAB — CYTOLOGY - PAP
Comment: NEGATIVE
Diagnosis: NEGATIVE
High risk HPV: NEGATIVE

## 2022-05-30 ENCOUNTER — Other Ambulatory Visit: Payer: Self-pay | Admitting: Nurse Practitioner

## 2022-05-30 DIAGNOSIS — F411 Generalized anxiety disorder: Secondary | ICD-10-CM

## 2022-06-02 ENCOUNTER — Ambulatory Visit: Payer: BC Managed Care – PPO | Admitting: Nurse Practitioner

## 2022-06-06 ENCOUNTER — Ambulatory Visit: Payer: BC Managed Care – PPO | Admitting: Nurse Practitioner

## 2022-06-06 ENCOUNTER — Encounter: Payer: Self-pay | Admitting: Nurse Practitioner

## 2022-06-28 ENCOUNTER — Other Ambulatory Visit: Payer: Self-pay | Admitting: Family

## 2022-06-28 DIAGNOSIS — I10 Essential (primary) hypertension: Secondary | ICD-10-CM

## 2022-06-29 NOTE — Telephone Encounter (Signed)
Requested Prescriptions  Pending Prescriptions Disp Refills  . losartan-hydrochlorothiazide (HYZAAR) 50-12.5 MG tablet [Pharmacy Med Name: LOSARTAN-HCTZ 50-12.5 MG TAB] 90 tablet 0    Sig: TAKE 1 TABLET BY MOUTH EVERY DAY     Cardiovascular: ARB + Diuretic Combos Passed - 06/28/2022  2:19 AM      Passed - K in normal range and within 180 days    Potassium  Date Value Ref Range Status  03/31/2022 4.3 3.5 - 5.1 mEq/L Final         Passed - Na in normal range and within 180 days    Sodium  Date Value Ref Range Status  03/31/2022 141 137 - 147 Final         Passed - Cr in normal range and within 180 days    Creatinine  Date Value Ref Range Status  03/31/2022 0.6 0.5 - 1.1 Final   Creat  Date Value Ref Range Status  04/12/2012 0.61 0.50 - 1.10 mg/dL Final   Creatinine, Ser  Date Value Ref Range Status  09/08/2021 0.57 0.44 - 1.00 mg/dL Final         Passed - eGFR is 10 or above and within 180 days    GFR calc Af Amer  Date Value Ref Range Status  05/25/2016 >60 >60 mL/min Final    Comment:    (NOTE) The eGFR has been calculated using the CKD EPI equation. This calculation has not been validated in all clinical situations. eGFR's persistently <60 mL/min signify possible Chronic Kidney Disease.    GFR, Estimated  Date Value Ref Range Status  09/08/2021 >60 >60 mL/min Final    Comment:    (NOTE) Calculated using the CKD-EPI Creatinine Equation (2021)    eGFR  Date Value Ref Range Status  03/31/2022 112  Final         Passed - Patient is not pregnant      Passed - Last BP in normal range    BP Readings from Last 1 Encounters:  05/26/22 126/82         Passed - Valid encounter within last 6 months    Recent Outpatient Visits          6 months ago Essential (primary) hypertension   Primary Care at [facility], Amy J, NP   10 months ago Encounter to establish care   Primary Care at [facility], [place], NP   10 years ago Routine general  medical examination at a health care facility   Primary Care at [facility], MD   10 years ago Depression   [facility] at [facility], Ranell Patrick, MD

## 2022-07-29 ENCOUNTER — Ambulatory Visit
Admission: RE | Admit: 2022-07-29 | Payer: BC Managed Care – PPO | Source: Home / Self Care | Admitting: Gastroenterology

## 2022-07-29 ENCOUNTER — Encounter: Admission: RE | Payer: Self-pay | Source: Home / Self Care

## 2022-07-29 SURGERY — COLONOSCOPY WITH PROPOFOL
Anesthesia: General

## 2022-09-20 ENCOUNTER — Ambulatory Visit: Payer: BC Managed Care – PPO | Admitting: Nurse Practitioner

## 2022-09-22 ENCOUNTER — Ambulatory Visit (INDEPENDENT_AMBULATORY_CARE_PROVIDER_SITE_OTHER): Payer: BC Managed Care – PPO | Admitting: Nurse Practitioner

## 2022-09-22 VITALS — BP 128/72 | HR 79 | Temp 98.4°F | Resp 12 | Ht 64.0 in | Wt 170.1 lb

## 2022-09-22 DIAGNOSIS — F419 Anxiety disorder, unspecified: Secondary | ICD-10-CM | POA: Diagnosis not present

## 2022-09-22 DIAGNOSIS — G4700 Insomnia, unspecified: Secondary | ICD-10-CM | POA: Diagnosis not present

## 2022-09-22 DIAGNOSIS — R110 Nausea: Secondary | ICD-10-CM | POA: Insufficient documentation

## 2022-09-22 DIAGNOSIS — I10 Essential (primary) hypertension: Secondary | ICD-10-CM | POA: Diagnosis not present

## 2022-09-22 MED ORDER — LOSARTAN POTASSIUM-HCTZ 50-12.5 MG PO TABS: 1.0000 | ORAL_TABLET | Freq: Every day | ORAL | 2 refills | Status: DC

## 2022-09-22 MED ORDER — ONDANSETRON 4 MG PO TBDP: 4.0000 mg | ORAL_TABLET | Freq: Three times a day (TID) | ORAL | 0 refills | Status: DC | PRN

## 2022-09-22 MED ORDER — HYDROXYZINE HCL 10 MG PO TABS: 10.0000 mg | ORAL_TABLET | Freq: Every evening | ORAL | 0 refills | Status: DC | PRN

## 2022-09-22 NOTE — Assessment & Plan Note (Signed)
Patient currently maintained on losartan-HCTZ 50-milligrams.  Patient tolerates medication well.  Blood pressure within normal limits.  Continue taking losartan HCTZ as prescribed.

## 2022-09-22 NOTE — Assessment & Plan Note (Signed)
PHQ-9 and GAD-7 administered in office.  Patient denies HI/SI/AVH.  Patient was trialed on buspirone 5 mg twice daily but made the patient too tired to FUNCTION.  Patient states she does have a sensitivity to medications.  She states that she can likely get with anxiety has been a long-term pain.  Patient not currently in therapy.  Will trial hydroxyzine 10 mg nightly as needed for for sleep but hopefully will help with some of the patient's anxiety. If  This medication is too sedating we did talk and will try trazodone nightly as needed afterwards.

## 2022-09-22 NOTE — Progress Notes (Signed)
Established Patient Office Visit  Subjective   Patient ID: Wendy Simpson, female    DOB: 1976-04-02  Age: 46 y.o. MRN: [id number]  Chief Complaint  Patient presents with   Anxiety    Stopped Buspar   Medication Management    Would like to get a couple of Zofran tablets to have for next week, she is having wisdom teeth extraction and she will be put under with anesthesia and after that she usually is nauseas from past experience. Would like to have the medication in case    HPI  Anxiety: Patient is single 05/04/2022 PHQ-9 and GAD-7 were administered both screens were positive patient was started on BuSpar 5 mg twice daily.  Patient was requested to follow-up in 4 weeks for recheck.  Patient was unable to make the follow-up.  And has stopped BuSpar per her report.  Patient is here for follow-up on anxiety. She did reach out via MyChart states she tried the BuSpar for about 3 weeks but it made her tired that she is having difficulty getting through her day  States that she has not tried therapy in the past. States that she feels that the anxiety is 80% job related.   States that she can go to bed around 8 beciase she is cold. States that she will read approx 2 hours prior to going to sleep. Does not watch tv in bed. States that she does have a fan and sleeps covered up and will wake up hot. Only has one throw blanket. Usually gets up 2-3 being hot. Will get up at 6 in the am   States that she is having her wisdom teeth out on Wednesday. States that she is going to be "put under" she traditionally gets sick with ane      05/04/2022    4:23 PM 09/02/2021    8:14 AM  PHQ9 SCORE ONLY  PHQ-9 Total Score 11 0       05/04/2022    4:23 PM  GAD 7 : Generalized Anxiety Score  Nervous, Anxious, on Edge 1  Control/stop worrying 1  Worry too much - different things 2  Trouble relaxing 2  Restless 1  Easily annoyed or irritable 3  Afraid - awful might happen 1  Total GAD 7 Score 11   Anxiety Difficulty Somewhat difficult       Review of Systems  Constitutional:  Negative for chills and fever.  Respiratory:  Negative for shortness of breath.   Cardiovascular:  Negative for chest pain.  Neurological:  Negative for headaches.  Psychiatric/Behavioral:  Negative for hallucinations and suicidal ideas. The patient has insomnia.       Objective:     BP 128/72   Pulse 79   Temp 98.4 F (36.9 C)   Resp 12   Ht '5\' 4"'$  (1.626 m)   Wt 170 lb 2 oz (77.2 kg)   SpO2 97%   BMI 29.20 kg/m    Physical Exam Vitals and nursing note reviewed.  Constitutional:      Appearance: Normal appearance.  Cardiovascular:     Rate and Rhythm: Normal rate and regular rhythm.     Heart sounds: Normal heart sounds.  Pulmonary:     Effort: Pulmonary effort is normal.     Breath sounds: Normal breath sounds.  Skin:    General: Skin is warm.  Neurological:     Mental Status: She is alert.      No results found for any visits  on 09/22/22.    The 10-year ASCVD risk score (Arnett DK, et al., 2019) is: 1.6%    Assessment & Plan:   Problem List Items Addressed This Visit       Cardiovascular and Mediastinum   Essential (primary) hypertension    Patient currently maintained on losartan-HCTZ 50-milligrams.  Patient tolerates medication well.  Blood pressure within normal limits.  Continue taking losartan HCTZ as prescribed.      Relevant Medications   losartan-hydrochlorothiazide (HYZAAR) 50-12.5 MG tablet     Other   Nausea    Patient is to have her wisdom teeth removed and this can be given anesthesia.  Patient states she gets nauseous afterwards-prescription provided      Relevant Medications   ondansetron (ZOFRAN-ODT) 4 MG disintegrating tablet   Insomnia    Seems to be able to go to sleep but not stay asleep.  When she wakes up to take her back to sleep.  She does have anxiety.  Did discuss proper sleep hygiene.  Will try hydroxyzine 10 mg nightly as needed.   Patient states she is sensitive to medications if this is too much or ineffective would consider using a low-dose trazodone.      Relevant Medications   hydrOXYzine (ATARAX) 10 MG tablet   Anxiety - Primary    PHQ-9 and GAD-7 administered in office.  Patient denies HI/SI/AVH.  Patient was trialed on buspirone 5 mg twice daily but made the patient too tired to FUNCTION.  Patient states she does have a sensitivity to medications.  She states that she can likely get with anxiety has been a long-term pain.  Patient not currently in therapy.  Will trial hydroxyzine 10 mg nightly as needed for for sleep but hopefully will help with some of the patient's anxiety. If  This medication is too sedating we did talk and will try trazodone nightly as needed afterwards.      Relevant Medications   hydrOXYzine (ATARAX) 10 MG tablet    Return in about 8 months (around 05/23/2023).    Romilda Garret, NP

## 2022-09-22 NOTE — Patient Instructions (Addendum)
Nice to see you today Reach out via mychart and update with the sleep medication. We can consider trying trazodone if needed Follow up with me in approx 8 months for your next physical, sooner if you need me

## 2022-09-22 NOTE — Assessment & Plan Note (Addendum)
Seems to be able to go to sleep but not stay asleep.  When she wakes up to take her back to sleep.  She does have anxiety.  Did discuss proper sleep hygiene.  Will try hydroxyzine 10 mg nightly as needed.  Patient states she is sensitive to medications if this is too much or ineffective would consider using a low-dose trazodone.

## 2022-09-22 NOTE — Assessment & Plan Note (Signed)
Patient is to have her wisdom teeth removed and this can be given anesthesia.  Patient states she gets nauseous afterwards-prescription provided

## 2022-10-05 ENCOUNTER — Encounter: Payer: Self-pay | Admitting: Nurse Practitioner

## 2022-10-15 ENCOUNTER — Other Ambulatory Visit: Payer: Self-pay | Admitting: Nurse Practitioner

## 2022-10-15 DIAGNOSIS — G4700 Insomnia, unspecified: Secondary | ICD-10-CM

## 2022-10-18 ENCOUNTER — Encounter: Payer: Self-pay | Admitting: Nurse Practitioner

## 2022-12-21 ENCOUNTER — Ambulatory Visit: Payer: BC Managed Care – PPO | Admitting: Nurse Practitioner

## 2023-01-16 ENCOUNTER — Encounter: Payer: Self-pay | Admitting: Orthopaedic Surgery

## 2023-01-16 ENCOUNTER — Ambulatory Visit (INDEPENDENT_AMBULATORY_CARE_PROVIDER_SITE_OTHER): Payer: BC Managed Care – PPO | Admitting: Orthopaedic Surgery

## 2023-01-16 ENCOUNTER — Other Ambulatory Visit: Payer: Self-pay

## 2023-01-16 DIAGNOSIS — M25552 Pain in left hip: Secondary | ICD-10-CM | POA: Diagnosis not present

## 2023-01-16 NOTE — Progress Notes (Signed)
The patient comes in with continued left hip pain has been going on for over a year now.  She has had a steroid injection around her left hip trochanteric area.  She cannot sleep on that left side and denies any groin pain.  She has even changed her mattress recently.  She is work on hip strengthening exercises and taking anti-inflammatories as well as rest and.  X-rays previously of the left hip and pelvis were normal.  On exam she has severe pain over the tip of the greater trochanter and the proximal IT band but also over the ASIS.  There is no pain in the groin and the rotation is full.  She does have slight pain with weightbearing but is mainly laying on that side.  Given the failure conservative treatment for almost a year for her left hip, a MRI of the left hip is warranted to assess the gluteus medius and minimus tendons as well as any structures around the left hip that can be the source of the pain.  Will see her back in follow-up once we have this MRI.  She agrees with this treatment plan.  All questions and concerns were addressed and answered.

## 2023-01-27 DIAGNOSIS — R6882 Decreased libido: Secondary | ICD-10-CM | POA: Diagnosis not present

## 2023-01-27 DIAGNOSIS — N951 Menopausal and female climacteric states: Secondary | ICD-10-CM | POA: Diagnosis not present

## 2023-01-27 DIAGNOSIS — G479 Sleep disorder, unspecified: Secondary | ICD-10-CM | POA: Diagnosis not present

## 2023-01-27 DIAGNOSIS — I10 Essential (primary) hypertension: Secondary | ICD-10-CM | POA: Diagnosis not present

## 2023-01-27 DIAGNOSIS — E063 Autoimmune thyroiditis: Secondary | ICD-10-CM | POA: Diagnosis not present

## 2023-01-27 DIAGNOSIS — E7800 Pure hypercholesterolemia, unspecified: Secondary | ICD-10-CM | POA: Diagnosis not present

## 2023-01-27 DIAGNOSIS — Z6830 Body mass index (BMI) 30.0-30.9, adult: Secondary | ICD-10-CM | POA: Diagnosis not present

## 2023-01-27 DIAGNOSIS — Z130 Encounter for screening for diseases of the blood and blood-forming organs and certain disorders involving the immune mechanism: Secondary | ICD-10-CM | POA: Diagnosis not present

## 2023-01-27 DIAGNOSIS — Z131 Encounter for screening for diabetes mellitus: Secondary | ICD-10-CM | POA: Diagnosis not present

## 2023-02-01 ENCOUNTER — Encounter: Payer: Self-pay | Admitting: Nurse Practitioner

## 2023-02-05 ENCOUNTER — Ambulatory Visit
Admission: RE | Admit: 2023-02-05 | Discharge: 2023-02-05 | Disposition: A | Discharge status: Home / Self Care | Payer: BC Managed Care – PPO | Source: Ambulatory Visit | Attending: Orthopaedic Surgery | Admitting: Orthopaedic Surgery

## 2023-02-05 DIAGNOSIS — M25552 Pain in left hip: Secondary | ICD-10-CM | POA: Diagnosis not present

## 2023-02-06 ENCOUNTER — Ambulatory Visit: Payer: BC Managed Care – PPO | Admitting: Orthopaedic Surgery

## 2023-02-14 ENCOUNTER — Telehealth: Payer: Self-pay

## 2023-02-14 NOTE — Telephone Encounter (Signed)
Please review MRI. Pt is scheduled for tomorrow to for MRI review. Should we just refer to Dr.Bokshan?

## 2023-02-14 NOTE — Telephone Encounter (Signed)
Lvm for pt to cb to r/s

## 2023-02-15 ENCOUNTER — Ambulatory Visit: Payer: BC Managed Care – PPO | Admitting: Physician Assistant

## 2023-02-20 ENCOUNTER — Ambulatory Visit (INDEPENDENT_AMBULATORY_CARE_PROVIDER_SITE_OTHER): Payer: BC Managed Care – PPO | Admitting: Obstetrics and Gynecology

## 2023-02-20 VITALS — BP 138/85 | HR 96 | Wt 180.0 lb

## 2023-02-20 DIAGNOSIS — D219 Benign neoplasm of connective and other soft tissue, unspecified: Secondary | ICD-10-CM | POA: Diagnosis not present

## 2023-02-20 NOTE — Progress Notes (Signed)
Obstetrics and Gynecology Visit Return Patient Evaluation  Appointment Date: 02/20/2023  Primary Care Provider: Eden Emms  OBGYN Clinic: [facility] for [facility]  Chief Complaint: fibroid seen on mri  History of Present Illness:  Wendy Simpson is a 47 y.o. that had left hip MRI in mid April by her Orthopedist. Multiple fibroids seen with a 4cm one seen in the left uterine body on the images that it got. Patient recommended to have evaluation by her Orthopedist.  Patient localizes her pain to the left hip but sometimes has left sided pain that is near/at the left pelvic area. No AUB or s/s of menopause and she continues to have qmonth, regular periods. Patient not aware of any h/o fibroids. Only imaging is from >10 years and CT was normal then.   Review of Systems: as noted in the History of Present Illness.  Patient Active Problem List   Diagnosis Date Noted   Nausea 09/22/2022   Insomnia 09/22/2022   Anxiety 09/22/2022   Essential (primary) hypertension 09/22/2022   Menopausal and female climacteric states 05/26/2022   Vaginal dryness 05/26/2022   Preventative health care 05/04/2022   Bilateral hand pain 05/04/2022   History of breast cancer 05/04/2022   Other fatigue 05/04/2022   GAD (generalized anxiety disorder) 05/04/2022   Breast cancer ([facility]) 06/15/2021   History of TIA (transient ischemic attack) 04/15/2012   Cholelithiasis 07/08/2008   Allergic rhinitis 06/19/2008   ASTHMA, EXERCISE INDUCED 06/19/2008   NEPHROLITHIASIS, HX OF 06/19/2008   Asthma, exercise induced 06/19/2008   Medications:  [name] had no medications administered during this visit. Current Outpatient Medications  Medication Sig Dispense Refill   hydrOXYzine (ATARAX) 10 MG tablet TAKE 1 TABLET BY MOUTH AT BEDTIME AS NEEDED. 30 tablet 1   losartan-hydrochlorothiazide (HYZAAR) 50-12.5 MG tablet Take 1 tablet by mouth daily. 90 tablet 2   ondansetron (ZOFRAN-ODT) 4 MG  disintegrating tablet Take 1 tablet (4 mg total) by mouth every 8 (eight) hours as needed for nausea or vomiting. 20 tablet 0   No current facility-administered medications for this visit.    Allergies: is allergic to codeine.  Physical Exam:  BP 138/85   Pulse 96   Wt 180 lb (81.6 kg)   BMI 30.90 kg/m  Body mass index is 30.9 kg/m. General appearance: Well nourished, well developed female in no acute distress.  Neuro/Psych:  Normal mood and affect.    Assessment: patient stable  Plan:  1. Fibroid D/w her that likely incidental finding but may sometimes cause pelvic pain. Recommend early cycle u/s for full evaluation - US PELVIC COMPLETE WITH TRANSVAGINAL; Future   Future Appointments  Date Time Provider Department Center  03/15/2023  2:40 PM Eden Emms, NP [facility]  05/10/2023  8:20 AM Sagardia, Eilleen Kempf, MD [facility] None    Cornelia Copa MD Attending Center for [facility])

## 2023-02-20 NOTE — Progress Notes (Signed)
RGYN patient here per notes for F/U .  Pt had imaging studies done of left Hip recently fibroids were noted pt told to F/U for evaluation. Reports cramping and discomfort for years on left side.   02/05/23 IMPRESSION: 1. No hip fracture, dislocation or avascular necrosis. 2. Small left superior anterior labral tear. 3. 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 4. Multiple uterine fibroids with the largest measuring 4.2 cm along the left uterine body.

## 2023-03-07 ENCOUNTER — Telehealth: Payer: Self-pay

## 2023-03-07 NOTE — Telephone Encounter (Signed)
Left message for pt to call office back regarding referral for [place] trans

## 2023-03-15 ENCOUNTER — Other Ambulatory Visit: Payer: Self-pay

## 2023-03-15 ENCOUNTER — Encounter: Payer: BC Managed Care – PPO | Admitting: Nurse Practitioner

## 2023-03-15 ENCOUNTER — Emergency Department: Payer: BC Managed Care – PPO

## 2023-03-15 ENCOUNTER — Emergency Department
Admission: EM | Admit: 2023-03-15 | Discharge: 2023-03-16 | Disposition: A | Discharge status: Home / Self Care | Payer: BC Managed Care – PPO | Attending: Emergency Medicine | Admitting: Emergency Medicine

## 2023-03-15 DIAGNOSIS — K219 Gastro-esophageal reflux disease without esophagitis: Secondary | ICD-10-CM

## 2023-03-15 DIAGNOSIS — R0789 Other chest pain: Secondary | ICD-10-CM

## 2023-03-15 DIAGNOSIS — J45909 Unspecified asthma, uncomplicated: Secondary | ICD-10-CM | POA: Insufficient documentation

## 2023-03-15 DIAGNOSIS — Z853 Personal history of malignant neoplasm of breast: Secondary | ICD-10-CM | POA: Diagnosis not present

## 2023-03-15 DIAGNOSIS — R079 Chest pain, unspecified: Secondary | ICD-10-CM | POA: Diagnosis not present

## 2023-03-15 LAB — CBC WITH DIFFERENTIAL/PLATELET
Abs Immature Granulocytes: 0.05 10*3/uL (ref 0.00–0.07)
Basophils Absolute: 0.1 10*3/uL (ref 0.0–0.1)
Basophils Relative: 1 %
Eosinophils Absolute: 0.1 10*3/uL (ref 0.0–0.5)
Eosinophils Relative: 1 %
HCT: 40.4 % (ref 36.0–46.0)
Hemoglobin: 13.3 g/dL (ref 12.0–15.0)
Immature Granulocytes: 0 %
Lymphocytes Relative: 30 %
Lymphs Abs: 3.7 10*3/uL (ref 0.7–4.0)
MCH: 29.8 pg (ref 26.0–34.0)
MCHC: 32.9 g/dL (ref 30.0–36.0)
MCV: 90.4 fL (ref 80.0–100.0)
Monocytes Absolute: 0.8 10*3/uL (ref 0.1–1.0)
Monocytes Relative: 6 %
Neutro Abs: 7.4 10*3/uL (ref 1.7–7.7)
Neutrophils Relative %: 62 %
Platelets: 334 10*3/uL (ref 150–400)
RBC: 4.47 MIL/uL (ref 3.87–5.11)
RDW: 13.2 % (ref 11.5–15.5)
WBC: 12 10*3/uL — ABNORMAL HIGH (ref 4.0–10.5)
nRBC: 0 % (ref 0.0–0.2)

## 2023-03-15 LAB — COMPREHENSIVE METABOLIC PANEL
ALT: 25 U/L (ref 0–44)
AST: 25 U/L (ref 15–41)
Albumin: 4 g/dL (ref 3.5–5.0)
Alkaline Phosphatase: 49 U/L (ref 38–126)
Anion gap: 11 (ref 5–15)
BUN: 20 mg/dL (ref 6–20)
CO2: 22 mmol/L (ref 22–32)
Calcium: 9.2 mg/dL (ref 8.9–10.3)
Chloride: 103 mmol/L (ref 98–111)
Creatinine, Ser: 0.53 mg/dL (ref 0.44–1.00)
GFR, Estimated: >60 mL/min (ref >60)
Glucose, Bld: 121 mg/dL — ABNORMAL HIGH (ref 70–99)
Potassium: 3.5 mmol/L (ref 3.5–5.1)
Sodium: 136 mmol/L (ref 135–145)
Total Bilirubin: 0.7 mg/dL (ref 0.3–1.2)
Total Protein: 7.6 g/dL (ref 6.5–8.1)

## 2023-03-15 LAB — TROPONIN I (HIGH SENSITIVITY): Troponin I (High Sensitivity): <2 ng/L (ref <18)

## 2023-03-15 NOTE — ED Provider Notes (Signed)
[facility] Provider Note    Event Date/Time   First MD Initiated Contact with Patient 03/15/23 2243     (approximate)   History   Chest Pain   HPI Wendy Simpson is a 47 y.o. female whose medical history includes asthma, breast cancer status post preventative bilateral mastectomy, history of TIA that she reports was due to a small blood clot, etc.  She presents with reports of chest pain in the left side of her chest.  She states that she was told years ago that she has an ulcer but that she has never done anything about it and that the doctors did not tell her to take anything for it.  She has been having a lot of burning in her upper abdomen that sometimes seems associated with pain in her chest, but not always.  She can have 1 and not the other.  When her chest pain is hurting, she sometimes feels a little bit "woozy and lightheaded".  This is atypical for her.  She has nausea from time to time but no vomiting.  She has had no recent fever.  She does not have shortness of breath associated with the chest pain.  She has not had any recent immobilizations nor surgeries.  She said that she was on blood thinners for about a month after her hospitalization for the TIA, but then she followed up with neurology and they took her off the blood thinners and she has not been on any since then.     Physical Exam   Triage Vital Signs: ED Triage Vitals  Enc Vitals Group     BP 03/15/23 1937 (!) 143/86     Pulse Rate 03/15/23 1937 87     Resp 03/15/23 1937 18     Temp 03/15/23 1937 98.1 F (36.7 C)     Temp Source 03/15/23 1937 Oral     SpO2 03/15/23 1937 96 %     Weight 03/15/23 1938 79.8 kg (176 lb)     Height 03/15/23 1938 1.626 m (5\' 4" )     Head Circumference --      Peak Flow --      Pain Score 03/15/23 1938 5     Pain Loc --      Pain Edu? --      Excl. in GC? --     Most recent vital signs: Vitals:   03/15/23 1937  BP: (!) 143/86  Pulse: 87   Resp: 18  Temp: 98.1 F (36.7 C)  SpO2: 96%    General: Awake, no distress.  CV:  Good peripheral perfusion.  Regular rate and rhythm, normal heart sounds. Resp:  Normal effort. Speaking easily and comfortably, no accessory muscle usage nor intercostal retractions.  Lungs clear to auscultation bilaterally.  No wheezes, rales, nor rhonchi. Abd:  No distention.  Patient has mild tenderness to palpation of the epigastrium with no guarding.  No right upper quadrant tenderness with negative Murphy sign.  No lower abdominal tenderness.  Abdomen is soft, not tense, not firm.  No peritonitis. Other:  No focal neurological deficits.  Regular mood and affect.   ED Results / Procedures / Treatments   Labs (all labs ordered are listed, but only abnormal results are displayed) Labs Reviewed  CBC WITH DIFFERENTIAL/PLATELET - Abnormal; Notable for the following components:      Result Value   WBC 12.0 (*)    All other components within normal limits  COMPREHENSIVE METABOLIC  PANEL - Abnormal; Notable for the following components:   Glucose, Bld 121 (*)    All other components within normal limits  POC URINE PREG, ED  TROPONIN I (HIGH SENSITIVITY)  TROPONIN I (HIGH SENSITIVITY)     EKG  ED ECG REPORT I, Loleta Rose, the attending physician, personally viewed and interpreted this ECG.  Date: 03/15/2023 EKG Time: 19: 33 Rate: 88 Rhythm: normal sinus rhythm QRS Axis: normal Intervals: normal ST/T Wave abnormalities: normal Narrative Interpretation: no evidence of acute ischemia    RADIOLOGY I viewed and interpreted the patient's 1 view chest x-ray.  I see no evidence of lobar pneumonia, volume overload, pneumothorax, nor other acute or concerning abnormalities.   PROCEDURES:  Critical Care performed: No  Procedures    IMPRESSION / MDM / ASSESSMENT AND PLAN / ED COURSE  I reviewed the triage vital signs and the nursing notes.                              Differential  diagnosis includes, but is not limited to, referred pain from acid reflux, PE, ACS, pneumothorax, pneumonia, less likely AAS.  Patient's presentation is most consistent with acute presentation with potential threat to life or bodily function.  Labs/studies ordered: EKG, 1 view chest x-ray, high-sensitivity troponin, CMP, CBC with differential, D-dimer  Interventions/Medications given:  Medications - No data to display  (Note:  hospital course my include additional interventions and/or labs/studies not listed above.)   Vital signs are stable and within normal limits, no hypoxia, no tachypnea, no tachycardia.  Patient's EKG is normal with no evidence of ischemia and her high-sensitivity troponin is negative.  Lab work is all normal in general.  Chest x-ray clear.  Patient most likely suffers from chronic acid reflux and/or a stomach ulcer but she has no evidence of perforation.  She does not take a PPI, which I counseled her to do.  I also encouraged her to follow-up with GI.  However, given the possible history of blood clot and her relatively new onset chest pain, it is reasonable to rule out PE.  She is still low risk so I had my usual D-dimer discussion with the patient and we decided to proceed with a dimer.  She understands that if it is above the upper limit of normal she will need a CTA chest.  I anticipate that she does not have a clot and will be able to be discharged for outpatient follow-up.     Clinical Course as of 03/18/23 0331  Thu Mar 16, 2023  0040 Transferring ED care to Dr. Katrinka Blazing to follow up on d-dimer and order a CTA if dimer is above upper limit of normal, and discharge if dimer is within normal limits.  Patient is aware of the transfer of care. [CF]    Clinical Course User Index [CF] Loleta Rose, MD     FINAL CLINICAL IMPRESSION(S) / ED DIAGNOSES   Final diagnoses:  Gastroesophageal reflux disease, unspecified whether esophagitis present  Atypical chest  pain     Rx / DC Orders   ED Discharge Orders     None        Note:  This document was prepared using Dragon voice recognition software and may include unintentional dictation errors.   Loleta Rose, MD 03/18/23 [phone]

## 2023-03-15 NOTE — ED Triage Notes (Signed)
Pt presents to ER with c/o center-left chest pain that started this morning, waking her from her sleep.  Pt reports the pain is constant in nature, but does ease off intermittently.  Pt denies radiation of chest pain.  States that she has felt 'woozy and light headed all day.  Like something just isn't right."  Pt is otherwise A&O x4 and in NAD.  Denies any cardiac hx, but states she has had blood clots in the past.

## 2023-03-16 LAB — D-DIMER, QUANTITATIVE: D-Dimer, Quant: <0.27 ug/mL-FEU (ref 0.00–0.50)

## 2023-03-16 MED ORDER — OMEPRAZOLE MAGNESIUM 20 MG PO TBEC: 20.0000 mg | DELAYED_RELEASE_TABLET | Freq: Every day | ORAL | 1 refills | Status: AC

## 2023-03-16 NOTE — ED Provider Notes (Signed)
Patient received in signout from Dr. York Cerise pending D-dimer to screen for PE.  This returns undetectable/negative and no indications for CTA chest.  We will discharge per original plan of care.   Delton Prairie, MD 03/16/23 [phone]

## 2023-03-17 ENCOUNTER — Telehealth: Payer: Self-pay

## 2023-03-17 NOTE — Telephone Encounter (Signed)
Left message for pt to call office back regarding u/s ordered in 01/2023

## 2023-03-29 ENCOUNTER — Ambulatory Visit: Admission: RE | Admit: 2023-03-29 | Payer: BC Managed Care – PPO | Source: Ambulatory Visit

## 2023-04-24 ENCOUNTER — Ambulatory Visit
Admission: RE | Admit: 2023-04-24 | Discharge: 2023-04-24 | Disposition: A | Discharge status: Home / Self Care | Payer: BC Managed Care – PPO | Source: Ambulatory Visit | Attending: Obstetrics and Gynecology | Admitting: Obstetrics and Gynecology

## 2023-04-24 DIAGNOSIS — D219 Benign neoplasm of connective and other soft tissue, unspecified: Secondary | ICD-10-CM | POA: Diagnosis not present

## 2023-04-24 DIAGNOSIS — D259 Leiomyoma of uterus, unspecified: Secondary | ICD-10-CM | POA: Diagnosis not present

## 2023-04-24 DIAGNOSIS — N888 Other specified noninflammatory disorders of cervix uteri: Secondary | ICD-10-CM | POA: Diagnosis not present

## 2023-04-24 DIAGNOSIS — N838 Other noninflammatory disorders of ovary, fallopian tube and broad ligament: Secondary | ICD-10-CM | POA: Diagnosis not present

## 2023-04-28 ENCOUNTER — Other Ambulatory Visit: Payer: Self-pay | Admitting: Obstetrics and Gynecology

## 2023-04-28 ENCOUNTER — Encounter: Payer: Self-pay | Admitting: Obstetrics and Gynecology

## 2023-04-28 DIAGNOSIS — N83292 Other ovarian cyst, left side: Secondary | ICD-10-CM | POA: Insufficient documentation

## 2023-05-10 ENCOUNTER — Encounter: Payer: Self-pay | Admitting: Emergency Medicine

## 2023-05-10 ENCOUNTER — Ambulatory Visit: Payer: BC Managed Care – PPO | Admitting: Emergency Medicine

## 2023-05-10 VITALS — BP 128/82 | HR 85 | Temp 98.0°F | Ht 64.0 in | Wt 180.2 lb

## 2023-05-10 DIAGNOSIS — R5383 Other fatigue: Secondary | ICD-10-CM

## 2023-05-10 DIAGNOSIS — I10 Essential (primary) hypertension: Secondary | ICD-10-CM

## 2023-05-10 DIAGNOSIS — Z6831 Body mass index (BMI) 31.0-31.9, adult: Secondary | ICD-10-CM

## 2023-05-10 DIAGNOSIS — Z1211 Encounter for screening for malignant neoplasm of colon: Secondary | ICD-10-CM

## 2023-05-10 DIAGNOSIS — Z7689 Persons encountering health services in other specified circumstances: Secondary | ICD-10-CM

## 2023-05-10 DIAGNOSIS — E6609 Other obesity due to excess calories: Secondary | ICD-10-CM | POA: Diagnosis not present

## 2023-05-10 MED ORDER — WEGOVY 0.25 MG/0.5ML ~~LOC~~ SOAJ
0.2500 mg | SUBCUTANEOUS | 3 refills | Status: DC
Start: 2023-05-10 — End: 2023-06-05

## 2023-05-10 NOTE — Patient Instructions (Signed)
Calorie Counting for Weight Loss Calories are units of energy. Your body needs a certain number of calories from food to keep going throughout the day. When you eat or drink more calories than your body needs, your body stores the extra calories mostly as fat. When you eat or drink fewer calories than your body needs, your body burns fat to get the energy it needs. Calorie counting means keeping track of how many calories you eat and drink each day. Calorie counting can be helpful if you need to lose weight. If you eat fewer calories than your body needs, you should lose weight. Ask your health care provider what a healthy weight is for you. For calorie counting to work, you will need to eat the right number of calories each day to lose a healthy amount of weight per week. A dietitian can help you figure out how many calories you need in a day and will suggest ways to reach your calorie goal. A healthy amount of weight to lose each week is usually 1-2 lb (0.5-0.9 kg). This usually means that your daily calorie intake should be reduced by 500-750 calories. Eating 1,200-1,500 calories a day can help most women lose weight. Eating 1,500-1,800 calories a day can help most men lose weight. What do I need to know about calorie counting? Work with your health care provider or dietitian to determine how many calories you should get each day. To meet your daily calorie goal, you will need to: Find out how many calories are in each food that you would like to eat. Try to do this before you eat. Decide how much of the food you plan to eat. Keep a food log. Do this by writing down what you ate and how many calories it had. To successfully lose weight, it is important to balance calorie counting with a healthy lifestyle that includes regular activity. Where do I find calorie information?  The number of calories in a food can be found on a Nutrition Facts label. If a food does not have a Nutrition Facts label, try  to look up the calories online or ask your dietitian for help. Remember that calories are listed per serving. If you choose to have more than one serving of a food, you will have to multiply the calories per serving by the number of servings you plan to eat. For example, the label on a package of bread might say that a serving size is 1 slice and that there are 90 calories in a serving. If you eat 1 slice, you will have eaten 90 calories. If you eat 2 slices, you will have eaten 180 calories. How do I keep a food log? After each time that you eat, record the following in your food log as soon as possible: What you ate. Be sure to include toppings, sauces, and other extras on the food. How much you ate. This can be measured in cups, ounces, or number of items. How many calories were in each food and drink. The total number of calories in the food you ate. Keep your food log near you, such as in a pocket-sized notebook or on an app or website on your mobile phone. Some programs will calculate calories for you and show you how many calories you have left to meet your daily goal. What are some portion-control tips? Know how many calories are in a serving. This will help you know how many servings you can have of a certain   food. Use a measuring cup to measure serving sizes. You could also try weighing out portions on a kitchen scale. With time, you will be able to estimate serving sizes for some foods. Take time to put servings of different foods on your favorite plates or in your favorite bowls and cups so you know what a serving looks like. Try not to eat straight from a food's packaging, such as from a bag or box. Eating straight from the package makes it hard to see how much you are eating and can lead to overeating. Put the amount you would like to eat in a cup or on a plate to make sure you are eating the right portion. Use smaller plates, glasses, and bowls for smaller portions and to prevent  overeating. Try not to multitask. For example, avoid watching TV or using your computer while eating. If it is time to eat, sit down at a table and enjoy your food. This will help you recognize when you are full. It will also help you be more mindful of what and how much you are eating. What are tips for following this plan? Reading food labels Check the calorie count compared with the serving size. The serving size may be smaller than what you are used to eating. Check the source of the calories. Try to choose foods that are high in protein, fiber, and vitamins, and low in saturated fat, trans fat, and sodium. Shopping Read nutrition labels while you shop. This will help you make healthy decisions about which foods to buy. Pay attention to nutrition labels for low-fat or fat-free foods. These foods sometimes have the same number of calories or more calories than the full-fat versions. They also often have added sugar, starch, or salt to make up for flavor that was removed with the fat. Make a grocery list of lower-calorie foods and stick to it. Cooking Try to cook your favorite foods in a healthier way. For example, try baking instead of frying. Use low-fat dairy products. Meal planning Use more fruits and vegetables. One-half of your plate should be fruits and vegetables. Include lean proteins, such as chicken, turkey, and fish. Lifestyle Each week, aim to do one of the following: 150 minutes of moderate exercise, such as walking. 75 minutes of vigorous exercise, such as running. General information Know how many calories are in the foods you eat most often. This will help you calculate calorie counts faster. Find a way of tracking calories that works for you. Get creative. Try different apps or programs if writing down calories does not work for you. What foods should I eat?  Eat nutritious foods. It is better to have a nutritious, high-calorie food, such as an avocado, than a food with  few nutrients, such as a bag of potato chips. Use your calories on foods and drinks that will fill you up and will not leave you hungry soon after eating. Examples of foods that fill you up are nuts and nut butters, vegetables, lean proteins, and high-fiber foods such as whole grains. High-fiber foods are foods with more than 5 g of fiber per serving. Pay attention to calories in drinks. Low-calorie drinks include water and unsweetened drinks. The items listed above may not be a complete list of foods and beverages you can eat. Contact a dietitian for more information. What foods should I limit? Limit foods or drinks that are not good sources of vitamins, minerals, or protein or that are high in unhealthy fats. These   include: Candy. Other sweets. Sodas, specialty coffee drinks, alcohol, and juice. The items listed above may not be a complete list of foods and beverages you should avoid. Contact a dietitian for more information. How do I count calories when eating out? Pay attention to portions. Often, portions are much larger when eating out. Try these tips to keep portions smaller: Consider sharing a meal instead of getting your own. If you get your own meal, eat only half of it. Before you start eating, ask for a container and put half of your meal into it. When available, consider ordering smaller portions from the menu instead of full portions. Pay attention to your food and drink choices. Knowing the way food is cooked and what is included with the meal can help you eat fewer calories. If calories are listed on the menu, choose the lower-calorie options. Choose dishes that include vegetables, fruits, whole grains, low-fat dairy products, and lean proteins. Choose items that are boiled, broiled, grilled, or steamed. Avoid items that are buttered, battered, fried, or served with cream sauce. Items labeled as crispy are usually fried, unless stated otherwise. Choose water, low-fat milk,  unsweetened iced tea, or other drinks without added sugar. If you want an alcoholic beverage, choose a lower-calorie option, such as a glass of wine or light beer. Ask for dressings, sauces, and syrups on the side. These are usually high in calories, so you should limit the amount you eat. If you want a salad, choose a garden salad and ask for grilled meats. Avoid extra toppings such as bacon, cheese, or fried items. Ask for the dressing on the side, or ask for olive oil and vinegar or lemon to use as dressing. Estimate how many servings of a food you are given. Knowing serving sizes will help you be aware of how much food you are eating at restaurants. Where to find more information Centers for Disease Control and Prevention: www.cdc.gov [place] Department of Agriculture: myplate.gov Summary Calorie counting means keeping track of how many calories you eat and drink each day. If you eat fewer calories than your body needs, you should lose weight. A healthy amount of weight to lose per week is usually 1-2 lb (0.5-0.9 kg). This usually means reducing your daily calorie intake by 500-750 calories. The number of calories in a food can be found on a Nutrition Facts label. If a food does not have a Nutrition Facts label, try to look up the calories online or ask your dietitian for help. Use smaller plates, glasses, and bowls for smaller portions and to prevent overeating. Use your calories on foods and drinks that will fill you up and not leave you hungry shortly after a meal. This information is not intended to replace advice given to you by your health care provider. Make sure you discuss any questions you have with your health care provider. Document Revised: 11/21/2019 Document Reviewed: 11/21/2019 Elsevier Patient Education  2023 [employer]  

## 2023-05-10 NOTE — Assessment & Plan Note (Signed)
Contributing to slow/low metabolism Benefits of exercise discussed Diet and nutrition discussed Advised to decrease amount of daily carbohydrate intake and daily calories and increase amount of plant-based protein in her diet Will benefit from weekly Wegovy.  Will titrate doses as tolerated

## 2023-05-10 NOTE — Progress Notes (Signed)
Wendy Simpson 47 y.o.   Chief Complaint  Patient presents with   New Patient (Initial Visit)    Patient takes that she has a lot of fatigue, tiredness, not concentrating   Weight loss     HISTORY OF PRESENT ILLNESS: This is a 47 y.o. female first visit to this office, here to establish care. Complaining of general fatigue and tiredness for several months. Concerned about her weight.  Would like to lose some weight. Wt Readings from Last 3 Encounters:  05/10/23 180 lb 4 oz (81.8 kg)  03/15/23 176 lb (79.8 kg)  02/20/23 180 lb (81.6 kg)  History of hypertension on Hyzaar 50-12.5 mg daily Not smoker.  No EtOH abuse. Stress levels stable. No other associated symptoms No other complaints or medical concerns today.   HPI   Prior to Admission medications   Medication Sig Start Date End Date Taking? Authorizing Provider  hydrOXYzine (ATARAX) 10 MG tablet TAKE 1 TABLET BY MOUTH AT BEDTIME AS NEEDED. 10/25/22   Eden Emms, NP  losartan-hydrochlorothiazide (HYZAAR) 50-12.5 MG tablet Take 1 tablet by mouth daily. 09/22/22   Eden Emms, NP  omeprazole (PRILOSEC OTC) 20 MG tablet Take 1 tablet (20 mg total) by mouth daily. 03/16/23 03/15/24  Loleta Rose, MD  ondansetron (ZOFRAN-ODT) 4 MG disintegrating tablet Take 1 tablet (4 mg total) by mouth every 8 (eight) hours as needed for nausea or vomiting. 09/22/22   Eden Emms, NP    Allergies  Allergen Reactions   Codeine     REACTION: Rash    Patient Active Problem List   Diagnosis Date Noted   Complex cyst of left ovary 04/28/2023   Insomnia 09/22/2022   Anxiety 09/22/2022   Essential (primary) hypertension 09/22/2022   History of breast cancer 05/04/2022   Other fatigue 05/04/2022   GAD (generalized anxiety disorder) 05/04/2022   Breast cancer (HCC) 06/15/2021   History of TIA (transient ischemic attack) 04/15/2012   Cholelithiasis 07/08/2008   Allergic rhinitis 06/19/2008   ASTHMA, EXERCISE INDUCED 06/19/2008    NEPHROLITHIASIS, HX OF 06/19/2008   Asthma, exercise induced 06/19/2008    Past Medical History:  Diagnosis Date   Anxiety    Asthma    exercise induced   Depression    Hypertension    PONV (postoperative nausea and vomiting)    Stroke (HCC) 10/24/2001   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    Past Surgical History:  Procedure Laterality Date   BREAST RECONSTRUCTION WITH PLACEMENT OF TISSUE EXPANDER AND FLEX HD (ACELLULAR HYDRATED DERMIS) Bilateral 06/15/2021   Procedure: BILATERAL BREAST RECONSTRUCTION WITH PLACEMENT OF TISSUE EXPANDERS AND FLEX HD (ACELLULAR HYDRATED DERMIS);  Surgeon: Allena Napoleon, MD;  Location: [facility];  Service: Plastics;  Laterality: Bilateral;   CESAREAN SECTION  1995   GALLBLADDER SURGERY  07/2010   KIDNEY STONE SURGERY  07/2011   NIPPLE SPARING MASTECTOMY Bilateral 06/15/2021   Procedure: BILATERAL NIPPLE SPARING MASTECTOMY;  Surgeon: Abigail Miyamoto, MD;  Location: [facility];  Service: General;  Laterality: Bilateral;   REMOVAL OF BILATERAL TISSUE EXPANDERS WITH PLACEMENT OF BILATERAL BREAST IMPLANTS Bilateral 09/14/2021   Procedure: REMOVAL OF BILATERAL TISSUE EXPANDERS WITH PLACEMENT OF BILATERAL BREAST IMPLANTS;  Surgeon: Allena Napoleon, MD;  Location: [facility];  Service: Plastics;  Laterality: Bilateral;  1.5 hour   TUBAL LIGATION  2002    Social History   Socioeconomic History   Marital status: Married    Spouse name: Not  on file   Number of children: 2   Years of education: Not on file   Highest education level: High school graduate  Occupational History   Not on file  Tobacco Use   Smoking status: Never    Passive exposure: Never   Smokeless tobacco: Never  Vaping Use   Vaping status: Never Used  Substance and Sexual Activity   Alcohol use: Yes    Comment: socailly   Drug use: Never   Sexual activity: Yes    Birth control/protection: Surgical  Other Topics Concern   Not on file  Social  History Narrative   Fulltime: [employer]   Social Determinants of Health   Financial Resource Strain: Not on file  Food Insecurity: Not on file  Transportation Needs: Not on file  Physical Activity: Not on file  Stress: Not on file  Social Connections: Not on file  Intimate Partner Violence: Not on file    Family History  Problem Relation Age of Onset   Cancer Mother 58       L BREAST   Hypertension Mother 25   Raynaud syndrome Mother        NOT SURE AGE   Breast cancer Mother 1   Hypertension Father 61   Kidney disease Maternal Grandfather      Review of Systems  Constitutional:  Positive for malaise/fatigue. Negative for chills and fever.  HENT: Negative.  Negative for congestion and sore throat.   Respiratory: Negative.  Negative for cough and shortness of breath.   Cardiovascular: Negative.  Negative for chest pain and palpitations.  Gastrointestinal:  Negative for abdominal pain, diarrhea, nausea and vomiting.  Genitourinary: Negative.  Negative for dysuria and hematuria.  Skin: Negative.  Negative for rash.  Neurological: Negative.  Negative for dizziness and headaches.  All other systems reviewed and are negative.   Today's Vitals   05/10/23 0826  BP: 128/82  Pulse: 85  Temp: 98 F (36.7 C)  TempSrc: Oral  SpO2: 97%  Weight: 180 lb 4 oz (81.8 kg)  Height: 5\' 4"  (1.626 m)   Body mass index is 30.94 kg/m.   Physical Exam Vitals reviewed.  Constitutional:      Appearance: Normal appearance.  HENT:     Head: Normocephalic.     Mouth/Throat:     Mouth: Mucous membranes are moist.     Pharynx: Oropharynx is clear.  Eyes:     Extraocular Movements: Extraocular movements intact.     Conjunctiva/sclera: Conjunctivae normal.     Pupils: Pupils are equal, round, and reactive to light.  Cardiovascular:     Rate and Rhythm: Normal rate and regular rhythm.     Pulses: Normal pulses.     Heart sounds: Normal heart sounds.  Pulmonary:      Effort: Pulmonary effort is normal.     Breath sounds: Normal breath sounds.  Musculoskeletal:     Cervical back: No tenderness.  Lymphadenopathy:     Cervical: No cervical adenopathy.  Skin:    General: Skin is warm and dry.     Capillary Refill: Capillary refill takes less than 2 seconds.  Neurological:     General: No focal deficit present.     Mental Status: She is alert and oriented to person, place, and time.  Psychiatric:        Mood and Affect: Mood normal.        Behavior: Behavior normal.      ASSESSMENT & PLAN: A total of  48 minutes was spent with the patient and counseling/coordination of care regarding preparing for this visit, review of available medical records, establishing care with me, review of chronic medical problems and their management, review of all medications, review of most recent blood work results, education on nutrition, prognosis, documentation and need for follow-up.  Problem List Items Addressed This Visit       Cardiovascular and Mediastinum   Essential (primary) hypertension    BP Readings from Last 3 Encounters:  05/10/23 128/82  03/16/23 (!) 142/84  02/20/23 138/85  Well-controlled hypertension Continue Hyzaar 50-12.5 mg daily Cardiovascular risks associated with hypertension discussed Diet and nutrition discussed         Other   Other fatigue - Primary    Clinically stable.  No red flag signs or symptoms. Recent blood work including CBC, CMP, hemoglobin A1c, hormone levels, lipid profile within normal limits Thyroid panel within normal limits Blood work done last May Differential diagnosis discussed including possibility of sleep apnea Probably has metabolic syndrome Diet and nutrition discussed Advised to decrease amount of daily carbohydrate intake and daily calories and increase amount of plant-based protein in her diet Benefits of exercise discussed      Class 1 obesity due to excess calories without serious comorbidity  with body mass index (BMI) of 31.0 to 31.9 in adult    Contributing to slow/low metabolism Benefits of exercise discussed Diet and nutrition discussed Advised to decrease amount of daily carbohydrate intake and daily calories and increase amount of plant-based protein in her diet Will benefit from weekly Wegovy.  Will titrate doses as tolerated      Relevant Medications   Semaglutide-Weight Management (WEGOVY) 0.25 MG/0.5ML SOAJ   Other Visit Diagnoses     Screening for colon cancer       Relevant Orders   Ambulatory referral to Gastroenterology   Tiredness       Encounter to establish care          Patient Instructions  Calorie Counting for Weight Loss Calories are units of energy. Your body needs a certain number of calories from food to keep going throughout the day. When you eat or drink more calories than your body needs, your body stores the extra calories mostly as fat. When you eat or drink fewer calories than your body needs, your body burns fat to get the energy it needs. Calorie counting means keeping track of how many calories you eat and drink each day. Calorie counting can be helpful if you need to lose weight. If you eat fewer calories than your body needs, you should lose weight. Ask your health care provider what a healthy weight is for you. For calorie counting to work, you will need to eat the right number of calories each day to lose a healthy amount of weight per week. A dietitian can help you figure out how many calories you need in a day and will suggest ways to reach your calorie goal. A healthy amount of weight to lose each week is usually 1-2 lb (0.5-0.9 kg). This usually means that your daily calorie intake should be reduced by 500-750 calories. Eating 1,200-1,500 calories a day can help most women lose weight. Eating 1,500-1,800 calories a day can help most men lose weight. What do I need to know about calorie counting? Work with your health care provider or  dietitian to determine how many calories you should get each day. To meet your daily calorie goal, you will need to:  Find out how many calories are in each food that you would like to eat. Try to do this before you eat. Decide how much of the food you plan to eat. Keep a food log. Do this by writing down what you ate and how many calories it had. To successfully lose weight, it is important to balance calorie counting with a healthy lifestyle that includes regular activity. Where do I find calorie information?  The number of calories in a food can be found on a Nutrition Facts label. If a food does not have a Nutrition Facts label, try to look up the calories online or ask your dietitian for help. Remember that calories are listed per serving. If you choose to have more than one serving of a food, you will have to multiply the calories per serving by the number of servings you plan to eat. For example, the label on a package of bread might say that a serving size is 1 slice and that there are 90 calories in a serving. If you eat 1 slice, you will have eaten 90 calories. If you eat 2 slices, you will have eaten 180 calories. How do I keep a food log? After each time that you eat, record the following in your food log as soon as possible: What you ate. Be sure to include toppings, sauces, and other extras on the food. How much you ate. This can be measured in cups, ounces, or number of items. How many calories were in each food and drink. The total number of calories in the food you ate. Keep your food log near you, such as in a pocket-sized notebook or on an app or website on your mobile phone. Some programs will calculate calories for you and show you how many calories you have left to meet your daily goal. What are some portion-control tips? Know how many calories are in a serving. This will help you know how many servings you can have of a certain food. Use a measuring cup to measure serving  sizes. You could also try weighing out portions on a kitchen scale. With time, you will be able to estimate serving sizes for some foods. Take time to put servings of different foods on your favorite plates or in your favorite bowls and cups so you know what a serving looks like. Try not to eat straight from a food's packaging, such as from a bag or box. Eating straight from the package makes it hard to see how much you are eating and can lead to overeating. Put the amount you would like to eat in a cup or on a plate to make sure you are eating the right portion. Use smaller plates, glasses, and bowls for smaller portions and to prevent overeating. Try not to multitask. For example, avoid watching TV or using your computer while eating. If it is time to eat, sit down at a table and enjoy your food. This will help you recognize when you are full. It will also help you be more mindful of what and how much you are eating. What are tips for following this plan? Reading food labels Check the calorie count compared with the serving size. The serving size may be smaller than what you are used to eating. Check the source of the calories. Try to choose foods that are high in protein, fiber, and vitamins, and low in saturated fat, trans fat, and sodium. Shopping Read nutrition labels while you shop. This will  help you make healthy decisions about which foods to buy. Pay attention to nutrition labels for low-fat or fat-free foods. These foods sometimes have the same number of calories or more calories than the full-fat versions. They also often have added sugar, starch, or salt to make up for flavor that was removed with the fat. Make a grocery list of lower-calorie foods and stick to it. Cooking Try to cook your favorite foods in a healthier way. For example, try baking instead of frying. Use low-fat dairy products. Meal planning Use more fruits and vegetables. One-half of your plate should be fruits and  vegetables. Include lean proteins, such as chicken, [place], and fish. Lifestyle Each week, aim to do one of the following: 150 minutes of moderate exercise, such as walking. 75 minutes of vigorous exercise, such as running. General information Know how many calories are in the foods you eat most often. This will help you calculate calorie counts faster. Find a way of tracking calories that works for you. Get creative. Try different apps or programs if writing down calories does not work for you. What foods should I eat?  Eat nutritious foods. It is better to have a nutritious, high-calorie food, such as an avocado, than a food with few nutrients, such as a bag of potato chips. Use your calories on foods and drinks that will fill you up and will not leave you hungry soon after eating. Examples of foods that fill you up are nuts and nut butters, vegetables, lean proteins, and high-fiber foods such as whole grains. High-fiber foods are foods with more than 5 g of fiber per serving. Pay attention to calories in drinks. Low-calorie drinks include water and unsweetened drinks. The items listed above may not be a complete list of foods and beverages you can eat. Contact a dietitian for more information. What foods should I limit? Limit foods or drinks that are not good sources of vitamins, minerals, or protein or that are high in unhealthy fats. These include: Candy. Other sweets. Sodas, specialty coffee drinks, alcohol, and juice. The items listed above may not be a complete list of foods and beverages you should avoid. Contact a dietitian for more information. How do I count calories when eating out? Pay attention to portions. Often, portions are much larger when eating out. Try these tips to keep portions smaller: Consider sharing a meal instead of getting your own. If you get your own meal, eat only half of it. Before you start eating, ask for a container and put half of your meal into  it. When available, consider ordering smaller portions from the menu instead of full portions. Pay attention to your food and drink choices. Knowing the way food is cooked and what is included with the meal can help you eat fewer calories. If calories are listed on the menu, choose the lower-calorie options. Choose dishes that include vegetables, fruits, whole grains, low-fat dairy products, and lean proteins. Choose items that are boiled, broiled, grilled, or steamed. Avoid items that are buttered, battered, fried, or served with cream sauce. Items labeled as crispy are usually fried, unless stated otherwise. Choose water, low-fat milk, unsweetened iced tea, or other drinks without added sugar. If you want an alcoholic beverage, choose a lower-calorie option, such as a glass of wine or light beer. Ask for dressings, sauces, and syrups on the side. These are usually high in calories, so you should limit the amount you eat. If you want a salad, choose a garden  salad and ask for grilled meats. Avoid extra toppings such as bacon, cheese, or fried items. Ask for the dressing on the side, or ask for olive oil and vinegar or lemon to use as dressing. Estimate how many servings of a food you are given. Knowing serving sizes will help you be aware of how much food you are eating at restaurants. Where to find more information Centers for Disease Control and Prevention: FootballExhibition.com.br U.S. Department of Agriculture: WrestlingReporter.dk Summary Calorie counting means keeping track of how many calories you eat and drink each day. If you eat fewer calories than your body needs, you should lose weight. A healthy amount of weight to lose per week is usually 1-2 lb (0.5-0.9 kg). This usually means reducing your daily calorie intake by 500-750 calories. The number of calories in a food can be found on a Nutrition Facts label. If a food does not have a Nutrition Facts label, try to look up the calories online or ask your  dietitian for help. Use smaller plates, glasses, and bowls for smaller portions and to prevent overeating. Use your calories on foods and drinks that will fill you up and not leave you hungry shortly after a meal. This information is not intended to replace advice given to you by your health care provider. Make sure you discuss any questions you have with your health care provider. Document Revised: 11/21/2019 Document Reviewed: 11/21/2019 Elsevier Patient Education  2023 Elsevier Inc.      Edwina Barth, MD [facility]

## 2023-05-10 NOTE — Assessment & Plan Note (Signed)
BP Readings from Last 3 Encounters:  05/10/23 128/82  03/16/23 (!) 142/84  02/20/23 138/85  Well-controlled hypertension Continue Hyzaar 50-12.5 mg daily Cardiovascular risks associated with hypertension discussed Diet and nutrition discussed

## 2023-05-10 NOTE — Assessment & Plan Note (Addendum)
Clinically stable.  No red flag signs or symptoms. Recent blood work including CBC, CMP, hemoglobin A1c, hormone levels, lipid profile within normal limits Thyroid panel within normal limits Blood work done last May Differential diagnosis discussed including possibility of sleep apnea Probably has metabolic syndrome Diet and nutrition discussed Advised to decrease amount of daily carbohydrate intake and daily calories and increase amount of plant-based protein in her diet Benefits of exercise discussed

## 2023-05-17 ENCOUNTER — Other Ambulatory Visit (HOSPITAL_COMMUNITY): Payer: Self-pay

## 2023-05-17 ENCOUNTER — Telehealth: Payer: Self-pay | Admitting: Pharmacy Technician

## 2023-05-17 ENCOUNTER — Encounter: Payer: Self-pay | Admitting: Emergency Medicine

## 2023-05-17 NOTE — Telephone Encounter (Signed)
Pharmacy Patient Advocate Encounter   Received notification from CoverMyMeds that prior authorization for Wegovy 0.25MG /0.5ML auto-injectors is required/requested.   Insurance verification completed.   The patient is insured through [facility] .   Per test claim: PA started via CoverMyMeds. KEY [id number] . Waiting for clinical questions to populate.

## 2023-05-17 NOTE — Telephone Encounter (Signed)
Clinical questions have been answered and prior authorization has been submitted.

## 2023-05-18 NOTE — Telephone Encounter (Signed)
Noted../lmb 

## 2023-05-18 NOTE — Telephone Encounter (Signed)
Pt need PA on Wegovy.Marland KitchenRaechel Simpson

## 2023-05-19 NOTE — Telephone Encounter (Signed)
Pharmacy Patient Advocate Encounter  Received notification from [facility] that Prior Authorization for [facility] 0.25MG /0.5ML auto-injectors has been DENIED. Please advise how you'd like to proceed. Full denial letter will be uploaded to the media tab. See denial reason below.  PA #/Case ID/Reference #: [id number]    Message from plan: Denied. This health benefit plan does not cover the following services, supplies, drugs or charges: Any treatment or regimen, medical or surgical, for the purpose of reducing or controlling the weight of the member, or for the treatment of obesity, except for surgical treatment of morbid obesity, or as specifically covered by this health benefit plan.

## 2023-05-22 ENCOUNTER — Telehealth: Payer: Self-pay | Admitting: Emergency Medicine

## 2023-05-22 ENCOUNTER — Encounter: Payer: Self-pay | Admitting: *Deleted

## 2023-05-22 NOTE — Telephone Encounter (Signed)
Pt called in request to schedule as a new pt .There is no shows and cancellation prior . Please advise [phone]

## 2023-05-22 NOTE — Telephone Encounter (Signed)
Diethylpropion is an oral, centrally-acting stimulant and sympathomimetic amine that is pharmacologically related to amphetamines.  Do not recommend use of amphetamines for weight loss so I will not be prescribing this medication.  Any recommendations from her insurance regarding weight loss medications?

## 2023-05-22 NOTE — Telephone Encounter (Signed)
She is already established with me I did her CPE on 05/05/2023

## 2023-06-05 ENCOUNTER — Encounter: Payer: Self-pay | Admitting: Nurse Practitioner

## 2023-06-05 ENCOUNTER — Encounter: Payer: Self-pay | Admitting: *Deleted

## 2023-06-05 ENCOUNTER — Ambulatory Visit (INDEPENDENT_AMBULATORY_CARE_PROVIDER_SITE_OTHER): Payer: BC Managed Care – PPO | Admitting: Nurse Practitioner

## 2023-06-05 VITALS — BP 124/80 | HR 68 | Temp 98.9°F | Ht 64.0 in | Wt 179.0 lb

## 2023-06-05 DIAGNOSIS — Z8673 Personal history of transient ischemic attack (TIA), and cerebral infarction without residual deficits: Secondary | ICD-10-CM | POA: Diagnosis not present

## 2023-06-05 DIAGNOSIS — R519 Headache, unspecified: Secondary | ICD-10-CM

## 2023-06-05 DIAGNOSIS — E669 Obesity, unspecified: Secondary | ICD-10-CM | POA: Diagnosis not present

## 2023-06-05 DIAGNOSIS — R5383 Other fatigue: Secondary | ICD-10-CM | POA: Diagnosis not present

## 2023-06-05 DIAGNOSIS — Z1231 Encounter for screening mammogram for malignant neoplasm of breast: Secondary | ICD-10-CM | POA: Diagnosis not present

## 2023-06-05 DIAGNOSIS — I10 Essential (primary) hypertension: Secondary | ICD-10-CM

## 2023-06-05 DIAGNOSIS — Z0000 Encounter for general adult medical examination without abnormal findings: Secondary | ICD-10-CM | POA: Diagnosis not present

## 2023-06-05 DIAGNOSIS — G478 Other sleep disorders: Secondary | ICD-10-CM

## 2023-06-05 LAB — LIPID PANEL
Cholesterol: 197 mg/dL (ref 0–200)
HDL: 42.2 mg/dL (ref >39.00)
LDL Cholesterol: 132 mg/dL — ABNORMAL HIGH (ref 0–99)
NonHDL: 155.09
Total CHOL/HDL Ratio: 5
Triglycerides: 115 mg/dL (ref 0.0–149.0)
VLDL: 23 mg/dL (ref 0.0–40.0)

## 2023-06-05 LAB — COMPREHENSIVE METABOLIC PANEL
ALT: 17 U/L (ref 0–35)
AST: 16 U/L (ref 0–37)
Albumin: 3.9 g/dL (ref 3.5–5.2)
Alkaline Phosphatase: 52 U/L (ref 39–117)
BUN: 13 mg/dL (ref 6–23)
CO2: 26 mEq/L (ref 19–32)
Calcium: 8.8 mg/dL (ref 8.4–10.5)
Chloride: 103 mEq/L (ref 96–112)
Creatinine, Ser: 0.55 mg/dL (ref 0.40–1.20)
GFR: 108.04 mL/min (ref >60.00)
Glucose, Bld: 94 mg/dL (ref 70–99)
Potassium: 3.6 mEq/L (ref 3.5–5.1)
Sodium: 138 mEq/L (ref 135–145)
Total Bilirubin: 0.5 mg/dL (ref 0.2–1.2)
Total Protein: 6.3 g/dL (ref 6.0–8.3)

## 2023-06-05 LAB — CBC
HCT: 38.3 % (ref 36.0–46.0)
Hemoglobin: 12.4 g/dL (ref 12.0–15.0)
MCHC: 32.4 g/dL (ref 30.0–36.0)
MCV: 91 fl (ref 78.0–100.0)
Platelets: 294 10*3/uL (ref 150.0–400.0)
RBC: 4.21 Mil/uL (ref 3.87–5.11)
RDW: 13.7 % (ref 11.5–15.5)
WBC: 6.8 10*3/uL (ref 4.0–10.5)

## 2023-06-05 LAB — VITAMIN D 25 HYDROXY (VIT D DEFICIENCY, FRACTURES): VITD: 26.23 ng/mL — ABNORMAL LOW (ref 30.00–100.00)

## 2023-06-05 LAB — HEMOGLOBIN A1C: Hgb A1c MFr Bld: 5.5 % (ref 4.6–6.5)

## 2023-06-05 LAB — VITAMIN B12: Vitamin B-12: 581 pg/mL (ref 211–911)

## 2023-06-05 LAB — TSH: TSH: 1.13 u[IU]/mL (ref 0.35–5.50)

## 2023-06-05 MED ORDER — CYCLOBENZAPRINE HCL 5 MG PO TABS
5.0000 mg | ORAL_TABLET | Freq: Three times a day (TID) | ORAL | 1 refills | Status: AC | PRN
Start: 2023-06-05 — End: ?

## 2023-06-05 NOTE — Assessment & Plan Note (Signed)
Work on healthy lifestyle modifications. 

## 2023-06-05 NOTE — Assessment & Plan Note (Signed)
Multifactorial has been talked about in the past will check labs inclusive of TSH, B12, vitamin D.  Patient is Epworth Sleepiness Scale was elevated due to at home sleep study

## 2023-06-05 NOTE — Assessment & Plan Note (Signed)
History of constant fatigue and nonrestorative sleep and headaches in the morning order home sleep test.  Patient's Epworth Sleepiness Scale was 12

## 2023-06-05 NOTE — Assessment & Plan Note (Signed)
Discussed age-appropriate immunizations and screening exams.  Did review patient's personal, surgical, social, family histories.  Patient is up-to-date with all age-appropriate vaccinations that she would like.  Patient has already had CRC screening but canceled the appointment she will call and set up a new appointment.  Mammogram ordered for breast cancer screening.  She is followed by GYN and Pap smear is up-to-date.  Patient was given information at discharge about preventative healthcare maintenance with anticipatory guidance.

## 2023-06-05 NOTE — Assessment & Plan Note (Signed)
Frequent headaches it sounds a mixture of tension and possible sleep apnea.  Will write Flexeril 5 mg nightly as needed.  Sedation precautions reviewed

## 2023-06-05 NOTE — Progress Notes (Signed)
Established Patient Office Visit  Subjective   Patient ID: RIVEN FORST, female    DOB: 1976-06-13  Age: 47 y.o. MRN: [id number]  Chief Complaint  Patient presents with   Follow-up    Pt states that she is very tired everyday.    Medication Refill    Losartan      for complete physical and follow up of chronic conditions.   HTN: Patient currently maintained on losartan-hydrochlorothiazide 50-12.5  Anxiety: Patient is on buspirone 5 mg twice daily but had adverse drug event of making patient too tired to function.  We did trial patient on hydroxyzine 10 mg nightly as needed. States that it did help but now she is so sleepy   Headache: daily that she will have when she wakes up. States that it can be a a rubber band feeling. It can come up from the neck. She will have a background headache after that. States that it is daily. States that she will take ibuprofen that will lessen it but never gets it to go completley away   Immunizations: -Tetanus: Completed in 2010 -Influenza: Out of season -Shingles: Too young -Pneumonia: Too young  Diet: Fair diet. 2 meals and sometimes three. Not good with breakfast. States that she is doing water mostly  Exercise: No regular exercise. Work   Eye exam: Completes annually . Yearly.  Dental exam:  PRN needs updating   Colonoscopy: Has not done.  Did have an appointment canceled she will call and reschedule Lung Cancer Screening: N/A  Pap smear: Last done 05/26/2022 with [place], [place] GYN.  Pap was normal  Mammogram: Order placed for [facility]   Sleep: states approx 8 hours at night sometimes broken if continuious she still feels tired        Review of Systems  Constitutional:  Negative for chills and fever.  Respiratory:  Negative for shortness of breath.   Cardiovascular:  Negative for chest pain and leg swelling.  Gastrointestinal:  Negative for abdominal pain, blood in stool, constipation, diarrhea, nausea and  vomiting.       BM every couple days   Genitourinary:  Negative for dysuria and hematuria.  Neurological:  Positive for headaches. Negative for tingling.  Psychiatric/Behavioral:  Negative for hallucinations and suicidal ideas.       Objective:     BP 124/80   Pulse 68   Temp 98.9 F (37.2 C) (Temporal)   Ht 5\' 4"  (1.626 m)   Wt 179 lb (81.2 kg)   LMP 06/02/2023 (Approximate)   SpO2 98%   BMI 30.73 kg/m    Physical Exam Vitals and nursing note reviewed.  Constitutional:      Appearance: Normal appearance.  HENT:     Right Ear: Tympanic membrane, ear canal and external ear normal.     Left Ear: Tympanic membrane, ear canal and external ear normal.     Mouth/Throat:     Mouth: Mucous membranes are moist.     Pharynx: Oropharynx is clear.  Eyes:     Extraocular Movements: Extraocular movements intact.     Pupils: Pupils are equal, round, and reactive to light.  Cardiovascular:     Rate and Rhythm: Normal rate and regular rhythm.     Pulses: Normal pulses.     Heart sounds: Normal heart sounds.  Pulmonary:     Effort: Pulmonary effort is normal.     Breath sounds: Normal breath sounds.  Abdominal:     General: Bowel  sounds are normal. There is no distension.     Palpations: There is no mass.     Tenderness: There is no abdominal tenderness.     Hernia: No hernia is present.  Musculoskeletal:     Right lower leg: No edema.     Left lower leg: No edema.  Lymphadenopathy:     Cervical: No cervical adenopathy.  Skin:    General: Skin is warm.  Neurological:     General: No focal deficit present.     Mental Status: She is alert.     Deep Tendon Reflexes:     Reflex Scores:      Bicep reflexes are 2+ on the right side and 2+ on the left side.      Patellar reflexes are 2+ on the right side and 2+ on the left side.    Comments: Bilateral upper and lower extremity strength 5/5  Psychiatric:        Mood and Affect: Mood normal.        Behavior: Behavior normal.         Thought Content: Thought content normal.        Judgment: Judgment normal.      No results found for any visits on 06/05/23.    The ASCVD Risk score (Arnett DK, et al., 2019) failed to calculate for the following reasons:   The patient has a prior MI or stroke diagnosis    Assessment & Plan:   Problem List Items Addressed This Visit       Cardiovascular and Mediastinum   Essential (primary) hypertension    Patient currently maintained on lisinopril-hydrochlorothiazide.  Blood pressure under good control continue medication as prescribed      Relevant Orders   Hemoglobin A1c   Lipid panel     Nervous and Auditory   Non-restorative sleep    History of constant fatigue and nonrestorative sleep and headaches in the morning order home sleep test.  Patient's Epworth Sleepiness Scale was 12      Relevant Orders   Ambulatory referral to Sleep Studies     Other   Frequent headaches    Frequent headaches it sounds a mixture of tension and possible sleep apnea.  Will write Flexeril 5 mg nightly as needed.  Sedation precautions reviewed      Relevant Medications   cyclobenzaprine (FLEXERIL) 5 MG tablet   Other Relevant Orders   Ambulatory referral to Sleep Studies   History of TIA (transient ischemic attack)    Continue blood pressure management      Preventative health care - Primary    Discussed age-appropriate immunizations and screening exams.  Did review patient's personal, surgical, social, family histories.  Patient is up-to-date with all age-appropriate vaccinations that she would like.  Patient has already had CRC screening but canceled the appointment she will call and set up a new appointment.  Mammogram ordered for breast cancer screening.  She is followed by GYN and Pap smear is up-to-date.  Patient was given information at discharge about preventative healthcare maintenance with anticipatory guidance.      Relevant Orders   CBC   Comprehensive metabolic  panel   TSH   Fatigue    Multifactorial has been talked about in the past will check labs inclusive of TSH, B12, vitamin D.  Patient is Epworth Sleepiness Scale was elevated due to at home sleep study      Relevant Orders   CBC   Vitamin B12  Comprehensive metabolic panel   TSH   VITAMIN D 25 Hydroxy (Vit-D Deficiency, Fractures)   Ambulatory referral to Sleep Studies   Obesity (BMI 30-39.9)    Work on healthy lifestyle modifications.      Relevant Orders   Hemoglobin A1c   Lipid panel   Ambulatory referral to Sleep Studies   Other Visit Diagnoses     Screening mammogram for breast cancer       Relevant Orders   MM 3D SCREENING MAMMOGRAM BILATERAL BREAST       Return in about 1 year (around 06/04/2024) for CPE and Labs.    Audria Nine, NP

## 2023-06-05 NOTE — Assessment & Plan Note (Signed)
Patient currently maintained on lisinopril-hydrochlorothiazide.  Blood pressure under good control continue medication as prescribed

## 2023-06-05 NOTE — Assessment & Plan Note (Signed)
Continue blood pressure management.

## 2023-06-05 NOTE — Patient Instructions (Addendum)
Nice to see you today I will be in touch with the labs once I have reviewed them Follow up with me in 1 year, sooner if you need me   Consider updating you tetanus vaccine

## 2023-06-07 ENCOUNTER — Telehealth: Payer: Self-pay

## 2023-06-07 NOTE — Telephone Encounter (Signed)
[facility] was unable to reach pt for a Sleep Apnea Test. Contacted pt to give her their phone number to complete registration process. Pt wrote number down and has no questions or concerns.

## 2023-06-14 ENCOUNTER — Other Ambulatory Visit: Payer: Self-pay | Admitting: Nurse Practitioner

## 2023-06-14 DIAGNOSIS — I10 Essential (primary) hypertension: Secondary | ICD-10-CM

## 2023-06-27 ENCOUNTER — Encounter: Payer: Self-pay | Admitting: Nurse Practitioner

## 2023-06-27 NOTE — Telephone Encounter (Signed)
Last office visit you wanted to work on lifestyle changes. Do you need to bring in office to follow up on options?

## 2023-07-07 ENCOUNTER — Ambulatory Visit: Payer: BC Managed Care – PPO

## 2023-07-17 ENCOUNTER — Ambulatory Visit
Admission: RE | Admit: 2023-07-17 | Discharge: 2023-07-17 | Disposition: A | Discharge status: Home / Self Care | Payer: BC Managed Care – PPO | Source: Ambulatory Visit | Attending: Nurse Practitioner

## 2023-07-17 DIAGNOSIS — Z1231 Encounter for screening mammogram for malignant neoplasm of breast: Secondary | ICD-10-CM

## 2023-08-04 DIAGNOSIS — Z23 Encounter for immunization: Secondary | ICD-10-CM | POA: Diagnosis not present

## 2023-08-10 ENCOUNTER — Ambulatory Visit: Payer: BC Managed Care – PPO | Admitting: Emergency Medicine

## 2023-09-18 ENCOUNTER — Ambulatory Visit: Payer: BC Managed Care – PPO

## 2023-09-27 ENCOUNTER — Telehealth: Payer: Self-pay

## 2023-09-27 NOTE — Telephone Encounter (Signed)
Left message for pt to call office back regarding cancelled ultrasound appointments per Dr. Vergie Living.

## 2023-11-13 ENCOUNTER — Ambulatory Visit: Payer: BC Managed Care – PPO | Admitting: Nurse Practitioner

## 2023-11-13 ENCOUNTER — Ambulatory Visit (INDEPENDENT_AMBULATORY_CARE_PROVIDER_SITE_OTHER)
Admission: RE | Admit: 2023-11-13 | Discharge: 2023-11-13 | Disposition: A | Discharge status: Home / Self Care | Payer: BC Managed Care – PPO | Source: Ambulatory Visit | Attending: Nurse Practitioner

## 2023-11-13 VITALS — BP 128/90 | HR 80 | Temp 98.3°F | Ht 64.0 in | Wt 183.0 lb

## 2023-11-13 DIAGNOSIS — M7732 Calcaneal spur, left foot: Secondary | ICD-10-CM | POA: Diagnosis not present

## 2023-11-13 DIAGNOSIS — S93402A Sprain of unspecified ligament of left ankle, initial encounter: Secondary | ICD-10-CM | POA: Diagnosis not present

## 2023-11-13 DIAGNOSIS — W19XXXA Unspecified fall, initial encounter: Secondary | ICD-10-CM | POA: Insufficient documentation

## 2023-11-13 DIAGNOSIS — M79672 Pain in left foot: Secondary | ICD-10-CM | POA: Insufficient documentation

## 2023-11-13 NOTE — Assessment & Plan Note (Signed)
Patient felt that he of her house mechanical in nature.  Did not hit head did not lose A1c.

## 2023-11-13 NOTE — Progress Notes (Signed)
   Acute Office Visit  Subjective:     Patient ID: Wendy Simpson, female    DOB: [birth year], 48 y.o.   MRN: [id number]  Chief Complaint  Patient presents with   Foot Pain    Pt complains of falling and twisting left foot on edge of rug in backyard. Pt states it happened last Sunday.      Patient is in today for foot pain with a history of HTN, asthma, breast CA, GAD, TIA  States that a week ago yesterday she was stepping out and stepped out on the rugs that were stacked. States that she did roll the left ankle. Felt that it popped became swollen and bruised. States that the ankle is better but the outer edge of the foot is hurting and getting worse. It is worse with movement and weight baring. States that she has tried ibuprofen that has helped some. States that as the day goes on it it gets worse and feels almost sore. States that it will be "puffy" too.  Review of Systems  Constitutional:  Negative for chills and fever.  Respiratory:  Negative for shortness of breath.   Cardiovascular:  Negative for chest pain.  Musculoskeletal:  Positive for joint pain.  Neurological:  Negative for tingling, loss of consciousness, weakness and headaches.        Objective:    BP (!) 128/90   Pulse 80   Temp 98.3 F (36.8 C) (Oral)   Ht 5\' 4"  (1.626 m)   Wt 183 lb (83 kg)   LMP 10/23/2023 (Approximate)   SpO2 99%   BMI 31.41 kg/m    Physical Exam Vitals and nursing note reviewed.  Constitutional:      Appearance: Normal appearance.  Cardiovascular:     Rate and Rhythm: Normal rate and regular rhythm.     Heart sounds: Normal heart sounds.  Pulmonary:     Effort: Pulmonary effort is normal.     Breath sounds: Normal breath sounds.  Musculoskeletal:        General: Tenderness present.     Right foot: Normal pulse.     Left foot: Swelling, tenderness and bony tenderness present. Normal pulse.       Legs:     Comments: Bilateral PT and DP pulses 2+  Neurological:     Mental  Status: She is alert.     No results found for any visits on 11/13/23.      Assessment & Plan:   Problem List Items Addressed This Visit       Other   Fall - Primary   Patient felt that he of her house mechanical in nature.  Did not hit head did not lose A1c.      Relevant Orders   DG Foot Complete Left   Left foot pain   Secondary to fall.  Patient rolled her ankle.  Ankle has improved.  Patient having tenderness of left proximal fifth metatarsal pending x-ray rule out fracture. RICE therapy. OTC analgesic as needed      Relevant Orders   DG Foot Complete Left    No orders of the defined types were placed in this encounter.   Return in about 7 months (around 06/12/2024) for CPE and Labs.  Audria Nine, NP

## 2023-11-13 NOTE — Assessment & Plan Note (Addendum)
Secondary to fall.  Patient rolled her ankle.  Ankle has improved.  Patient having tenderness of left proximal fifth metatarsal pending x-ray rule out fracture. RICE therapy. OTC analgesic as needed

## 2023-11-13 NOTE — Patient Instructions (Addendum)
Nice to see you today Tylenol or ibuprofen as needed over the counter RICE therapy Follow up if no improvement   Follow up with me in 7 months for your physical

## 2023-11-14 ENCOUNTER — Encounter: Payer: Self-pay | Admitting: Nurse Practitioner

## 2023-11-28 ENCOUNTER — Encounter: Payer: Self-pay | Admitting: Nurse Practitioner

## 2023-12-17 NOTE — Progress Notes (Unsigned)
   Wendy Philbin T. Luberta Grabinski, MD, CAQ Sports Medicine [facility] [place] at [facility] [address] [place], [address]  Phone: [phone]  FAX: [phone]  Wendy Simpson - 48 y.o. female  MRN [id number]  Date of Birth: [birth year]  Date: 12/18/2023  PCP: Wendy Emms, NP  Referral: Wendy Emms, NP  No chief complaint on file.  Subjective:   Wendy Simpson is a 48 y.o. very pleasant female patient with There is no height or weight on file to calculate BMI. who presents with the following:  Patient presents with some ongoing left-sided ankle pain.    Review of Systems is noted in the HPI, as appropriate  Objective:   There were no vitals taken for this visit.  GEN: No acute distress; alert,appropriate. PULM: Breathing comfortably in no respiratory distress PSYCH: Normally interactive.   Laboratory and Imaging Data:  Assessment and Plan:   ***

## 2023-12-18 ENCOUNTER — Ambulatory Visit (INDEPENDENT_AMBULATORY_CARE_PROVIDER_SITE_OTHER): Payer: BC Managed Care – PPO | Admitting: Family Medicine

## 2023-12-18 ENCOUNTER — Encounter: Payer: Self-pay | Admitting: Family Medicine

## 2023-12-18 VITALS — BP 100/70 | HR 87 | Temp 98.9°F | Ht 64.0 in | Wt 187.2 lb

## 2023-12-18 DIAGNOSIS — M79672 Pain in left foot: Secondary | ICD-10-CM

## 2023-12-18 DIAGNOSIS — M25572 Pain in left ankle and joints of left foot: Secondary | ICD-10-CM | POA: Diagnosis not present

## 2024-01-05 DIAGNOSIS — Z6831 Body mass index (BMI) 31.0-31.9, adult: Secondary | ICD-10-CM | POA: Diagnosis not present

## 2024-01-05 DIAGNOSIS — I10 Essential (primary) hypertension: Secondary | ICD-10-CM | POA: Diagnosis not present

## 2024-01-05 DIAGNOSIS — E66811 Obesity, class 1: Secondary | ICD-10-CM | POA: Diagnosis not present

## 2024-01-10 ENCOUNTER — Ambulatory Visit: Payer: BC Managed Care – PPO | Admitting: Family Medicine

## 2024-01-10 ENCOUNTER — Ambulatory Visit: Payer: BC Managed Care – PPO | Admitting: Nurse Practitioner

## 2024-01-14 NOTE — Progress Notes (Deleted)
   Reah Justo T. Halima Fogal, MD, CAQ Sports Medicine [facility] at [facility] [address] [place], [address]  Phone: [phone]  FAX: [phone]  Wendy Simpson - 48 y.o. female  MRN [id number]  Date of Birth: 21-Jul-1976  Date: 01/15/2024  PCP: Eden Emms, NP  Referral: Eden Emms, NP  No chief complaint on file.  Subjective:   Wendy Simpson is a 48 y.o. very pleasant female patient with There is no height or weight on file to calculate BMI. who presents with the following:  I saw the patient about 1 month ago for some ongoing acute left-sided foot pain.  She initially had an inversion ankle injury, and she subsequently developed some pain at the base of the fifth metatarsal at the peroneal tendon.  I placed her in a short cam walker boot 1 month ago, and today she is here to follow-up about this.    Review of Systems is noted in the HPI, as appropriate  Objective:   LMP 11/20/2023   GEN: No acute distress; alert,appropriate. PULM: Breathing comfortably in no respiratory distress PSYCH: Normally interactive.   Laboratory and Imaging Data:  Assessment and Plan:   ***

## 2024-01-15 ENCOUNTER — Ambulatory Visit: Payer: BC Managed Care – PPO | Admitting: Family Medicine

## 2024-01-15 DIAGNOSIS — M25572 Pain in left ankle and joints of left foot: Secondary | ICD-10-CM

## 2024-01-15 DIAGNOSIS — M79672 Pain in left foot: Secondary | ICD-10-CM

## 2024-01-16 ENCOUNTER — Encounter: Payer: Self-pay | Admitting: Nurse Practitioner

## 2024-02-07 ENCOUNTER — Other Ambulatory Visit: Payer: Self-pay | Admitting: Nurse Practitioner

## 2024-02-07 DIAGNOSIS — R110 Nausea: Secondary | ICD-10-CM

## 2024-02-07 MED ORDER — ONDANSETRON 4 MG PO TBDP
4.0000 mg | ORAL_TABLET | Freq: Three times a day (TID) | ORAL | 0 refills | Status: DC | PRN
Start: 2024-02-07 — End: 2024-06-28

## 2024-02-27 ENCOUNTER — Encounter: Payer: Self-pay | Admitting: Nurse Practitioner

## 2024-06-07 ENCOUNTER — Ambulatory Visit (INDEPENDENT_AMBULATORY_CARE_PROVIDER_SITE_OTHER)

## 2024-06-07 ENCOUNTER — Ambulatory Visit (INDEPENDENT_AMBULATORY_CARE_PROVIDER_SITE_OTHER): Payer: Self-pay | Admitting: Podiatry

## 2024-06-07 ENCOUNTER — Encounter: Payer: Self-pay | Admitting: Podiatry

## 2024-06-07 DIAGNOSIS — M722 Plantar fascial fibromatosis: Secondary | ICD-10-CM

## 2024-06-07 MED ORDER — METHYLPREDNISOLONE 4 MG PO TBPK
ORAL_TABLET | ORAL | 0 refills | Status: DC
Start: 2024-06-07 — End: 2024-06-21

## 2024-06-07 NOTE — Patient Instructions (Signed)
 For instructions on how to put on your Night Splint, please visit [email or web address]   Plantar Fasciitis (Heel Spur Syndrome) with Rehab The plantar fascia is a fibrous, ligament-like, soft-tissue structure that spans the bottom of the foot. Plantar fasciitis is a condition that causes pain in the foot due to inflammation of the tissue. SYMPTOMS   Pain and tenderness on the underneath side of the foot.  Pain that worsens with standing or walking. CAUSES  Plantar fasciitis is caused by irritation and injury to the plantar fascia on the underneath side of the foot. Common mechanisms of injury include:  Direct trauma to bottom of the foot.  Damage to a small nerve that runs under the foot where the main fascia attaches to the heel bone.  Stress placed on the plantar fascia due to bone spurs. RISK INCREASES WITH:   Activities that place stress on the plantar fascia (running, jumping, pivoting, or cutting).  Poor strength and flexibility.  Improperly fitted shoes.  Tight calf muscles.  Flat feet.  Failure to warm-up properly before activity.  Obesity. PREVENTION  Warm up and stretch properly before activity.  Allow for adequate recovery between workouts.  Maintain physical fitness:  Strength, flexibility, and endurance.  Cardiovascular fitness.  Maintain a health body weight.  Avoid stress on the plantar fascia.  Wear properly fitted shoes, including arch supports for individuals who have flat feet.  PROGNOSIS  If treated properly, then the symptoms of plantar fasciitis usually resolve without surgery. However, occasionally surgery is necessary.  RELATED COMPLICATIONS   Recurrent symptoms that may result in a chronic condition.  Problems of the lower back that are caused by compensating for the injury, such as limping.  Pain or weakness of the foot during push-off following surgery.  Chronic inflammation, scarring, and partial or complete fascia tear,  occurring more often from repeated injections.  TREATMENT  Treatment initially involves the use of ice and medication to help reduce pain and inflammation. The use of strengthening and stretching exercises may help reduce pain with activity, especially stretches of the Achilles tendon. These exercises may be performed at home or with a therapist. Your caregiver may recommend that you use heel cups of arch supports to help reduce stress on the plantar fascia. Occasionally, corticosteroid injections are given to reduce inflammation. If symptoms persist for greater than 6 months despite non-surgical (conservative), then surgery may be recommended.   MEDICATION   If pain medication is necessary, then nonsteroidal anti-inflammatory medications, such as aspirin and ibuprofen, or other minor pain relievers, such as acetaminophen, are often recommended.  Do not take pain medication within 7 days before surgery.  Prescription pain relievers may be given if deemed necessary by your caregiver. Use only as directed and only as much as you need.  Corticosteroid injections may be given by your caregiver. These injections should be reserved for the most serious cases, because they may only be given a certain number of times.  HEAT AND COLD  Cold treatment (icing) relieves pain and reduces inflammation. Cold treatment should be applied for 10 to 15 minutes every 2 to 3 hours for inflammation and pain and immediately after any activity that aggravates your symptoms. Use ice packs or massage the area with a piece of ice (ice massage).  Heat treatment may be used prior to performing the stretching and strengthening activities prescribed by your caregiver, physical therapist, or athletic trainer. Use a heat pack or soak the injury in warm water.  SEEK IMMEDIATE MEDICAL CARE  IF:  Treatment seems to offer no benefit, or the condition worsens.  Any medications produce adverse side effects.  EXERCISES- RANGE OF  MOTION (ROM) AND STRETCHING EXERCISES - Plantar Fasciitis (Heel Spur Syndrome) These exercises may help you when beginning to rehabilitate your injury. Your symptoms may resolve with or without further involvement from your physician, physical therapist or athletic trainer. While completing these exercises, remember:   Restoring tissue flexibility helps normal motion to return to the joints. This allows healthier, less painful movement and activity.  An effective stretch should be held for at least 30 seconds.  A stretch should never be painful. You should only feel a gentle lengthening or release in the stretched tissue.  RANGE OF MOTION - Toe Extension, Flexion  Sit with your right / left leg crossed over your opposite knee.  Grasp your toes and gently pull them back toward the top of your foot. You should feel a stretch on the bottom of your toes and/or foot.  Hold this stretch for 10 seconds.  Now, gently pull your toes toward the bottom of your foot. You should feel a stretch on the top of your toes and or foot.  Hold this stretch for 10 seconds. Repeat  times. Complete this stretch 3 times per day.   RANGE OF MOTION - Ankle Dorsiflexion, Active Assisted  Remove shoes and sit on a chair that is preferably not on a carpeted surface.  Place right / left foot under knee. Extend your opposite leg for support.  Keeping your heel down, slide your right / left foot back toward the chair until you feel a stretch at your ankle or calf. If you do not feel a stretch, slide your bottom forward to the edge of the chair, while still keeping your heel down.  Hold this stretch for 10 seconds. Repeat 3 times. Complete this stretch 2 times per day.   STRETCH  Gastroc, Standing  Place hands on wall.  Extend right / left leg, keeping the front knee somewhat bent.  Slightly point your toes inward on your back foot.  Keeping your right / left heel on the floor and your knee straight, shift  your weight toward the wall, not allowing your back to arch.  You should feel a gentle stretch in the right / left calf. Hold this position for 10 seconds. Repeat 3 times. Complete this stretch 2 times per day.  STRETCH  Soleus, Standing  Place hands on wall.  Extend right / left leg, keeping the other knee somewhat bent.  Slightly point your toes inward on your back foot.  Keep your right / left heel on the floor, bend your back knee, and slightly shift your weight over the back leg so that you feel a gentle stretch deep in your back calf.  Hold this position for 10 seconds. Repeat 3 times. Complete this stretch 2 times per day.  STRETCH  Gastrocsoleus, Standing  Note: This exercise can place a lot of stress on your foot and ankle. Please complete this exercise only if specifically instructed by your caregiver.   Place the ball of your right / left foot on a step, keeping your other foot firmly on the same step.  Hold on to the wall or a rail for balance.  Slowly lift your other foot, allowing your body weight to press your heel down over the edge of the step.  You should feel a stretch in your right / left calf.  Hold this position  for 10 seconds.  Repeat this exercise with a slight bend in your right / left knee. Repeat 3 times. Complete this stretch 2 times per day.   STRENGTHENING EXERCISES - Plantar Fasciitis (Heel Spur Syndrome)  These exercises may help you when beginning to rehabilitate your injury. They may resolve your symptoms with or without further involvement from your physician, physical therapist or athletic trainer. While completing these exercises, remember:   Muscles can gain both the endurance and the strength needed for everyday activities through controlled exercises.  Complete these exercises as instructed by your physician, physical therapist or athletic trainer. Progress the resistance and repetitions only as guided.  STRENGTH - Towel Curls  Sit in  a chair positioned on a non-carpeted surface.  Place your foot on a towel, keeping your heel on the floor.  Pull the towel toward your heel by only curling your toes. Keep your heel on the floor. Repeat 3 times. Complete this exercise 2 times per day.  STRENGTH - Ankle Inversion  Secure one end of a rubber exercise band/tubing to a fixed object (table, pole). Loop the other end around your foot just before your toes.  Place your fists between your knees. This will focus your strengthening at your ankle.  Slowly, pull your big toe up and in, making sure the band/tubing is positioned to resist the entire motion.  Hold this position for 10 seconds.  Have your muscles resist the band/tubing as it slowly pulls your foot back to the starting position. Repeat 3 times. Complete this exercises 2 times per day.  Document Released: 10/10/2005 Document Revised: 01/02/2012 Document Reviewed: 01/22/2009 [facility] Patient Information 2014 [place], [place].

## 2024-06-07 NOTE — Progress Notes (Signed)
  Subjective:  Patient ID: Wendy Simpson, female    DOB: [birth year],  MRN: [id number]  Chief Complaint  Patient presents with   Plantar Fasciitis    Right heel pain. Chronic issue. Taken ibuprofen/Tylenol . 5 pain when walking. Non diabetic, OTC insoles  not working.    Discussed the use of AI scribe software for clinical note transcription with the patient, who gave verbal consent to proceed.  History of Present Illness Wendy Simpson is a 48 year old female who presents with right heel pain.  She experiences severe right heel pain, worsening by the end of the day after prolonged standing. The pain is described as burning and achy, with the most intensity at day's end and discomfort upon waking. Movement reduces morning pain, but it persists throughout the day. She compensates by walking on the ball of her foot, causing additional aching in her foot and leg at night. Occasional numbness and tingling occur, especially when the foot is achy. Driving exacerbates the pain, leading to a burning sensation upon exiting the car. She has not sustained any foot injuries. Ibuprofen, her only treatment, is no longer effective.      Objective:    Physical Exam General: AAO x3, NAD  Dermatological: Skin is warm, dry and supple bilateral.  There are no open sores, no preulcerative lesions, no rash or signs of infection present.  Vascular: Dorsalis Pedis artery and Posterior Tibial artery pedal pulses are 2/4 bilateral with immedate capillary fill time.  There is no pain with calf compression, swelling, warmth, erythema.   Neruologic: Grossly intact via light touch bilateral.  Negative any send.  Musculoskeletal: Tenderness to palpation along the plantar medial tubercle of the calcaneus at the insertion of plantar fascia on the right foot. There is no pain along the course of the plantar fascia within the arch of the foot. Plantar fascia appears to be intact. There is no pain with lateral compression  of the calcaneus or pain with vibratory sensation. There is no pain along the course or insertion of the achilles tendon. No other areas of tenderness to bilateral lower extremities.   Gait: Unassisted, Nonantalgic.     No images are attached to the encounter.    Results RADIOLOGY Foot X-ray: Small calcaneal spur on the plantar aspect of the heel; otherwise normal.  No evidence of acute fracture.   Assessment:   1. Plantar fasciitis of right foot      Plan:  Patient was evaluated and treated and all questions answered.  Assessment and Plan Assessment & Plan Right plantar fasciitis with associated heel spur - Prescribed Medrol  Dosepak for inflammation. -Discussed steroid injection. - Instructed on daily icing with a frozen water bottle. - Provided exercises to stretch calf, Achilles tendon, and plantar fascia. - Provided plantar fascial brace for support. - Provided night splint for dorsiflexion during rest. Static or dynamic ankle foot orthosis, including soft interface material, adjustable for fit, for positioning, may be used for minimal ambulation, prefabricated, off-the-shelf was dispensed on the billed date of service  - Advised on supportive footwear. - Sent prescription to [facility] in [place].   Return in about 6 weeks (around 07/19/2024), or if symptoms worsen or fail to improve, for plantar fasciitis .   Donnice JONELLE Fees DPM

## 2024-06-12 ENCOUNTER — Encounter: Payer: BC Managed Care – PPO | Admitting: Nurse Practitioner

## 2024-06-18 ENCOUNTER — Other Ambulatory Visit: Payer: Self-pay | Admitting: Nurse Practitioner

## 2024-06-18 DIAGNOSIS — I10 Essential (primary) hypertension: Secondary | ICD-10-CM

## 2024-06-21 ENCOUNTER — Ambulatory Visit: Admitting: Family Medicine

## 2024-06-21 ENCOUNTER — Encounter: Payer: Self-pay | Admitting: Family Medicine

## 2024-06-21 VITALS — BP 127/82 | HR 82 | Wt 196.0 lb

## 2024-06-21 DIAGNOSIS — Z1331 Encounter for screening for depression: Secondary | ICD-10-CM | POA: Diagnosis not present

## 2024-06-21 DIAGNOSIS — N951 Menopausal and female climacteric states: Secondary | ICD-10-CM | POA: Diagnosis not present

## 2024-06-21 MED ORDER — VENLAFAXINE HCL ER 37.5 MG PO CP24
37.5000 mg | ORAL_CAPSULE | Freq: Every day | ORAL | 1 refills | Status: DC
Start: 2024-06-21 — End: 2024-08-23

## 2024-06-21 NOTE — Progress Notes (Signed)
   GYNECOLOGY PROBLEM ENCOUNTER NOTE  Subjective:  Wendy Simpson is a 48 y.o. [id number] female here for a routine annual gynecologic exam.    Current complaints: mood changes- present the last 3-4 weeks, more severe. Notes increasing for the past 6 months. Associated with decreased libido as well.    - mostly irritability and anger - tried Anxiety meds --SSRI (doesn't remember name) and buspar  in her 53s  - Reports decreased desire for sex - Some hot flashes years ago but not present currenlty - notes some dryness  - continued to have monthly cycles-- normal in length and consistency except last cycle started with brown discharge first which is not typical for her.   Does not know when women in family go through menopause-- all women had hysterectomies  S/p bilateral masectomy with reconstruction  Denies abnormal vaginal bleeding, discharge, pelvic pain, problems with intercourse or other gynecologic concerns.    Gynecologic History Patient's last menstrual period was 06/17/2024 (exact date). Contraception: none Last Pap: 2023. Results were: normal Last mammogram: s/p masectomy  Health Maintenance Due  Topic Date Due   HIV Screening  Never done   Hepatitis C Screening  Never done   Pneumococcal Vaccine (1 of 2 - PCV) Never done   Hepatitis B Vaccines 19-59 Average Risk (1 of 3 - 19+ 3-dose series) Never done   DTaP/Tdap/Td (3 - Td or Tdap) 11/24/2018   Colonoscopy  Never done   COVID-19 Vaccine (3 - Pfizer risk series) 03/14/2020   INFLUENZA VACCINE  05/24/2024    The following portions of the patient's history were reviewed and updated as appropriate: allergies, current medications, past family history, past medical history, past social history, past surgical history and problem list.  Review of Systems Pertinent items are noted in HPI.   Objective:  BP 127/82   Pulse 82   Wt 196 lb (88.9 kg)   LMP 06/17/2024 (Exact Date)   BMI 33.64 kg/m  Gen: well appearing,  NAD HEENT: no scleral icterus CV: RR Lung: Normal WOB Ext: warm well perfused  Assessment and Plan:  1. Perimenopausal (Primary) Discussed that given she still has periods monthly she had estrogen and progesterone  cycling. Her sx are possibly related to perimenopause Has history of TIA thus avoiding HRT is appropriate Open to SNRI therapy and this was prescribed - venlafaxine  XR (EFFEXOR  XR) 37.5 MG 24 hr capsule; Take 1 capsule (37.5 mg total) by mouth daily with breakfast.  Dispense: 30 capsule; Refill: 1   Please refer to After Visit Summary for other counseling recommendations.   Return in about 2 months (around 08/21/2024) for f/u effexor  treatment. Future Appointments  Date Time Provider Department Center  07/08/2024  4:00 PM Wendee Lynwood HERO, NP [facility] [id number]  08/23/2024  8:55 AM Wendy Simpson, Suzen Octave, MD [facility]     Suzen Octave Eldonna, MD, MPH, ABFM Attending Physician Center for [facility]

## 2024-06-21 NOTE — Progress Notes (Signed)
 CC: wanting to discuss perimenopause   Describing worsening anxiety , weight gain, not wanting to get out of bed  Wanting to start here for hormonal lab check

## 2024-06-27 ENCOUNTER — Encounter: Payer: Self-pay | Admitting: Family Medicine

## 2024-06-27 DIAGNOSIS — R110 Nausea: Secondary | ICD-10-CM

## 2024-06-28 MED ORDER — ONDANSETRON 4 MG PO TBDP
4.0000 mg | ORAL_TABLET | Freq: Three times a day (TID) | ORAL | 0 refills | Status: AC | PRN
Start: 2024-06-28 — End: ?

## 2024-07-08 ENCOUNTER — Ambulatory Visit (INDEPENDENT_AMBULATORY_CARE_PROVIDER_SITE_OTHER): Admitting: Nurse Practitioner

## 2024-07-08 ENCOUNTER — Encounter: Payer: Self-pay | Admitting: Nurse Practitioner

## 2024-07-08 VITALS — BP 118/82 | HR 82 | Temp 98.0°F | Ht 63.65 in | Wt 197.4 lb

## 2024-07-08 DIAGNOSIS — Z114 Encounter for screening for human immunodeficiency virus [HIV]: Secondary | ICD-10-CM | POA: Diagnosis not present

## 2024-07-08 DIAGNOSIS — Z1159 Encounter for screening for other viral diseases: Secondary | ICD-10-CM | POA: Diagnosis not present

## 2024-07-08 DIAGNOSIS — G4700 Insomnia, unspecified: Secondary | ICD-10-CM

## 2024-07-08 DIAGNOSIS — Z0000 Encounter for general adult medical examination without abnormal findings: Secondary | ICD-10-CM

## 2024-07-08 DIAGNOSIS — I10 Essential (primary) hypertension: Secondary | ICD-10-CM | POA: Diagnosis not present

## 2024-07-08 DIAGNOSIS — Z131 Encounter for screening for diabetes mellitus: Secondary | ICD-10-CM | POA: Diagnosis not present

## 2024-07-08 DIAGNOSIS — R519 Headache, unspecified: Secondary | ICD-10-CM

## 2024-07-08 DIAGNOSIS — Z8673 Personal history of transient ischemic attack (TIA), and cerebral infarction without residual deficits: Secondary | ICD-10-CM

## 2024-07-08 DIAGNOSIS — Z13220 Encounter for screening for lipoid disorders: Secondary | ICD-10-CM

## 2024-07-08 DIAGNOSIS — Z1211 Encounter for screening for malignant neoplasm of colon: Secondary | ICD-10-CM

## 2024-07-08 MED ORDER — BUTALBITAL-APAP-CAFFEINE 50-325-40 MG PO TABS: 1.0000 | ORAL_TABLET | Freq: Four times a day (QID) | ORAL | 0 refills | Status: DC | PRN

## 2024-07-08 NOTE — Assessment & Plan Note (Signed)
 Stable neurologically intact

## 2024-07-08 NOTE — Progress Notes (Signed)
 Established Patient Office Visit  Subjective   Patient ID: Wendy Simpson, female    DOB: 06-25-1976  Age: 48 y.o. MRN: [id number]  Chief Complaint  Patient presents with   Annual Exam    HPI  HTN: Patient currently maintained on losartan -hydrochlorothiazide 50-12.5 mg daily. Does check it at work on occasion   Perimenopause: Currently maintained on venlafaxine  37.5 mg daily. Help ed level out her mood  Sleep disturbance: Currently maintained on hydroxyzine  10 mg nightly as needed. She is not sleeping with the effexor    Headaches: Patient currently maintained on Flexeril  5 mg 3 times daily as needed headaches. States that her neck and her head hurt daily. States that it ranges from a dull ahce to a sharp pain. States that she will take ibuprofen and it will dull. No aura. States that sometimes she has light sensitivity regardless of the headache . She will have dizziness that she will lay down then it will abate. State that she will move or get up and get dizziness   for complete physical and follow up of chronic conditions.  Immunizations: -Tetanus: Completed in 2010, deferred -Influenza: get it at work  -Shingles: too young  -Pneumonia: too young    Diet: Fair diet. She is eating 3 meals a day, not snacking. She is drinking water and a diet dr pepper Exercise: No regular exercise. Dealing with plantar and does not walk as much out   Eye exam: Completes annually  Dental exam: needs updating     Colonoscopy:  referral for [place] Lung Cancer Screening: NA   Pap smear: 05/26/2022 negative cells and HPV follow-up with Wendy Simpson, GYN  Mammogram: Mastectomy   DEXA: Too young, currently average risk  Sleep: goes to bed around 9-930 and get up at 615. She will get 4-5 hours that is broken. Worse with the effexor     Review of Systems  Constitutional:  Negative for chills and fever.  Respiratory:  Negative for shortness of breath.   Cardiovascular:  Negative for  chest pain and leg swelling.  Gastrointestinal:  Negative for abdominal pain, blood in stool, constipation, diarrhea, nausea and vomiting.       BM 2-3 times a week   Genitourinary:  Negative for dysuria and hematuria.  Neurological:  Positive for dizziness and headaches. Negative for tingling.  Psychiatric/Behavioral:  Negative for hallucinations and suicidal ideas.       Objective:     BP 118/82   Pulse 82   Temp 98 F (36.7 C) (Oral)   Ht 5' 3.65 (1.617 m)   Wt 197 lb 6.4 oz (89.5 kg)   LMP 06/17/2024 (Exact Date)   SpO2 99%   BMI 34.26 kg/m  BP Readings from Last 3 Encounters:  07/08/24 118/82  06/21/24 127/82  12/18/23 100/70   Wt Readings from Last 3 Encounters:  07/08/24 197 lb 6.4 oz (89.5 kg)  06/21/24 196 lb (88.9 kg)  12/18/23 187 lb 4 oz (84.9 kg)   SpO2 Readings from Last 3 Encounters:  07/08/24 99%  12/18/23 99%  11/13/23 99%      Physical Exam Vitals and nursing note reviewed.  Constitutional:      Appearance: Normal appearance.  HENT:     Right Ear: Tympanic membrane, ear canal and external ear normal.     Left Ear: Tympanic membrane, ear canal and external ear normal.     Mouth/Throat:     Mouth: Mucous membranes are moist.     Pharynx: Oropharynx  is clear.  Eyes:     Extraocular Movements: Extraocular movements intact.     Pupils: Pupils are equal, round, and reactive to light.  Cardiovascular:     Rate and Rhythm: Normal rate and regular rhythm.     Pulses: Normal pulses.     Heart sounds: Normal heart sounds.  Pulmonary:     Effort: Pulmonary effort is normal.     Breath sounds: Normal breath sounds.  Abdominal:     General: Bowel sounds are normal. There is no distension.     Palpations: There is no mass.     Tenderness: There is no abdominal tenderness.     Hernia: No hernia is present.  Musculoskeletal:     Right lower leg: No edema.     Left lower leg: No edema.  Lymphadenopathy:     Cervical: No cervical adenopathy.   Skin:    General: Skin is warm.  Neurological:     General: No focal deficit present.     Mental Status: She is alert.     Deep Tendon Reflexes:     Reflex Scores:      Bicep reflexes are 2+ on the right side and 2+ on the left side.      Patellar reflexes are 2+ on the right side and 2+ on the left side.    Comments: Bilateral upper and lower extremity strength 5/5  Psychiatric:        Mood and Affect: Mood normal.        Behavior: Behavior normal.        Thought Content: Thought content normal.        Judgment: Judgment normal.      No results found for any visits on 07/08/24.    The ASCVD Risk score (Arnett DK, et al., 2019) failed to calculate for the following reasons:   Risk score cannot be calculated because patient has a medical history suggesting prior/existing ASCVD    Assessment & Plan:   Problem List Items Addressed This Visit       Cardiovascular and Mediastinum   Essential (primary) hypertension   Patient currently maintained on losartan -hydrochlorothiazide 50-12.5 mg daily.  Blood pressure controlled.  Tolerating medication well.  Continue      Relevant Orders   Comprehensive metabolic panel with GFR   CBC with Differential/Platelet   TSH     Other   Frequent headaches   History of same with increasing frequency and severity.  Patient is also experiencing dizziness.  Does have a history of a stroke neurologically intact.  Patient has tried Flexeril  without great relief.  We will obtain MRI brain and refer to neurology.  We will trial Fioricet in the interim      Relevant Medications   butalbital -acetaminophen -caffeine  (FIORICET) 50-325-40 MG tablet   Other Relevant Orders   Ambulatory referral to Neurology   MR Brain Wo Contrast   History of TIA (transient ischemic attack)   Stable neurologically intact      Preventative health care - Primary   Discussed age-appropriate immunizations and screening exams.  Did review patient's personal,  surgical, social, family histories.  Patient is up-to-date with all age-appropriate vaccinations she would like.  Patient declined tetanus vaccine today and will get her flu vaccine through her employer.  Patient was given information at discharge about preventative healthcare maintenance with anticipatory guidance.  Referral placed today for CRC screening.  Patient no longer participates in breast cancer screening status post bilateral mastectomy.  Patient  is up-to-date on cervical cancer screening      Relevant Orders   Comprehensive metabolic panel with GFR   CBC with Differential/Platelet   TSH   Insomnia   History of same has tried hydroxyzine  as needed.  Currently taking Effexor  which seems to make it worse at night encourage patient to switch dosing to daytime      Other Visit Diagnoses       Encounter for hepatitis C screening test for low risk patient       Relevant Orders   Hepatitis C antibody     Encounter for screening for HIV       Relevant Orders   HIV antibody (with reflex)     Screening for colon cancer       Relevant Orders   Ambulatory referral to Gastroenterology     Screening for diabetes mellitus       Relevant Orders   Hemoglobin A1c     Screening for lipid disorders       Relevant Orders   Lipid panel       Return in about 1 year (around 07/08/2025) for CPE and Labs.    Adina Crandall, NP

## 2024-07-08 NOTE — Patient Instructions (Addendum)
 Nice to see you today I will be in touch with the labs once I have them Follow up with me in 1 year, sooner if you need me

## 2024-07-08 NOTE — Assessment & Plan Note (Signed)
 Discussed age-appropriate immunizations and screening exams.  Did review patient's personal, surgical, social, family histories.  Patient is up-to-date with all age-appropriate vaccinations she would like.  Patient declined tetanus vaccine today and will get her flu vaccine through her employer.  Patient was given information at discharge about preventative healthcare maintenance with anticipatory guidance.  Referral placed today for CRC screening.  Patient no longer participates in breast cancer screening status post bilateral mastectomy.  Patient is up-to-date on cervical cancer screening

## 2024-07-08 NOTE — Assessment & Plan Note (Signed)
 History of same with increasing frequency and severity.  Patient is also experiencing dizziness.  Does have a history of a stroke neurologically intact.  Patient has tried Flexeril  without great relief.  We will obtain MRI brain and refer to neurology.  We will trial Fioricet in the interim

## 2024-07-08 NOTE — Assessment & Plan Note (Signed)
 History of same has tried hydroxyzine  as needed.  Currently taking Effexor  which seems to make it worse at night encourage patient to switch dosing to daytime

## 2024-07-08 NOTE — Assessment & Plan Note (Signed)
 Patient currently maintained on losartan -hydrochlorothiazide 50-12.5 mg daily.  Blood pressure controlled.  Tolerating medication well.  Continue

## 2024-07-08 NOTE — Progress Notes (Signed)
   Established Patient Office Visit  Subjective   Patient ID: Wendy Simpson, female    DOB: [birth year]  Age: 48 y.o. MRN: [id number]  Chief Complaint  Patient presents with   Annual Exam    HPI  HTN: Patient currently maintained on losartan -hydrochlorothiazide 50-12.5 mg daily  Hot flashes: Currently maintained on venlafaxine  37.5 mg daily  Sleep disturbance: Currently maintained on hydroxyzine  10 mg nightly as needed  Headaches: Patient currently maintained on Flexeril  5 mg 3 times daily as needed headaches  for complete physical and follow up of chronic conditions.  Immunizations: -Tetanus: Completed in 2010? -Influenza: ? -Shingles: too young  -Pneumonia: too young    Diet: Fair diet.  Exercise: No regular exercise.  Eye exam: Completes annually  Dental exam: Completes semi-annually    Colonoscopy: Completed in  Lung Cancer Screening: Completed in   Pap smear: 05/26/2022 negative cells and HPV follow-up with [name], GYN  Mammogram: ?  DEXA: Too young, currently average risk  Sleep:   ROS    Objective:     Ht 5' 3.65 (1.617 m)   Wt 197 lb 6.4 oz (89.5 kg)   LMP 06/17/2024 (Exact Date)   BMI 34.26 kg/m    Physical Exam   No results found for any visits on 07/08/24.    The ASCVD Risk score (Arnett DK, et al., 2019) failed to calculate for the following reasons:   Risk score cannot be calculated because patient has a medical history suggesting prior/existing ASCVD    Assessment & Plan:   Problem List Items Addressed This Visit   None Visit Diagnoses       Encounter for hepatitis C screening test for low risk patient    -  Primary     Encounter for screening for HIV           No follow-ups on file.    Adina Crandall, NP

## 2024-07-09 ENCOUNTER — Encounter: Payer: Self-pay | Admitting: Neurology

## 2024-07-09 LAB — CBC WITH DIFFERENTIAL/PLATELET
Basophils Absolute: 0.1 K/uL (ref 0.0–0.1)
Basophils Relative: 0.7 % (ref 0.0–3.0)
Eosinophils Absolute: 0.2 K/uL (ref 0.0–0.7)
Eosinophils Relative: 2.2 % (ref 0.0–5.0)
HCT: 37.9 % (ref 36.0–46.0)
Hemoglobin: 12.6 g/dL (ref 12.0–15.0)
Lymphocytes Relative: 34.6 % (ref 12.0–46.0)
Lymphs Abs: 3.4 K/uL (ref 0.7–4.0)
MCHC: 33.2 g/dL (ref 30.0–36.0)
MCV: 89.4 fl (ref 78.0–100.0)
Monocytes Absolute: 0.7 K/uL (ref 0.1–1.0)
Monocytes Relative: 6.7 % (ref 3.0–12.0)
Neutro Abs: 5.5 K/uL (ref 1.4–7.7)
Neutrophils Relative %: 55.8 % (ref 43.0–77.0)
Platelets: 272 K/uL (ref 150.0–400.0)
RBC: 4.24 Mil/uL (ref 3.87–5.11)
RDW: 14 % (ref 11.5–15.5)
WBC: 9.8 K/uL (ref 4.0–10.5)

## 2024-07-09 LAB — LIPID PANEL
Cholesterol: 211 mg/dL — ABNORMAL HIGH (ref 0–200)
HDL: 53 mg/dL (ref >39.00)
LDL Cholesterol: 137 mg/dL — ABNORMAL HIGH (ref 0–99)
NonHDL: 158.13
Total CHOL/HDL Ratio: 4
Triglycerides: 104 mg/dL (ref 0.0–149.0)
VLDL: 20.8 mg/dL (ref 0.0–40.0)

## 2024-07-09 LAB — COMPREHENSIVE METABOLIC PANEL WITH GFR
ALT: 18 U/L (ref 0–35)
AST: 15 U/L (ref 0–37)
Albumin: 3.7 g/dL (ref 3.5–5.2)
Alkaline Phosphatase: 50 U/L (ref 39–117)
BUN: 16 mg/dL (ref 6–23)
CO2: 26 meq/L (ref 19–32)
Calcium: 8.9 mg/dL (ref 8.4–10.5)
Chloride: 102 meq/L (ref 96–112)
Creatinine, Ser: 0.62 mg/dL (ref 0.40–1.20)
GFR: 104.16 mL/min (ref >60.00)
Glucose, Bld: 90 mg/dL (ref 70–99)
Potassium: 3.7 meq/L (ref 3.5–5.1)
Sodium: 137 meq/L (ref 135–145)
Total Bilirubin: 0.2 mg/dL (ref 0.2–1.2)
Total Protein: 6.2 g/dL (ref 6.0–8.3)

## 2024-07-09 LAB — HEPATITIS C ANTIBODY: Hepatitis C Ab: NONREACTIVE

## 2024-07-09 LAB — HIV ANTIBODY (ROUTINE TESTING W REFLEX)
HIV 1&2 Ab, 4th Generation: NONREACTIVE
HIV FINAL INTERPRETATION: NEGATIVE

## 2024-07-09 LAB — TSH: TSH: 3.05 u[IU]/mL (ref 0.35–5.50)

## 2024-07-09 LAB — HEMOGLOBIN A1C: Hgb A1c MFr Bld: 6.1 % (ref 4.6–6.5)

## 2024-07-10 ENCOUNTER — Ambulatory Visit: Admitting: Obstetrics and Gynecology

## 2024-07-11 ENCOUNTER — Ambulatory Visit: Payer: Self-pay | Admitting: Nurse Practitioner

## 2024-07-11 DIAGNOSIS — R7303 Prediabetes: Secondary | ICD-10-CM | POA: Insufficient documentation

## 2024-07-11 DIAGNOSIS — E7800 Pure hypercholesterolemia, unspecified: Secondary | ICD-10-CM | POA: Insufficient documentation

## 2024-07-12 MED ORDER — ROSUVASTATIN CALCIUM 10 MG PO TABS: 10.0000 mg | ORAL_TABLET | Freq: Every day | ORAL | 0 refills | Status: DC

## 2024-07-12 NOTE — Telephone Encounter (Signed)
 lvm for pt to call office to schedule appt.

## 2024-07-12 NOTE — Telephone Encounter (Signed)
 Needs 3 month FASTING lab visit please

## 2024-07-13 ENCOUNTER — Other Ambulatory Visit: Payer: Self-pay | Admitting: Family Medicine

## 2024-07-13 DIAGNOSIS — N951 Menopausal and female climacteric states: Secondary | ICD-10-CM

## 2024-07-18 ENCOUNTER — Telehealth: Payer: Self-pay | Admitting: Nurse Practitioner

## 2024-07-18 NOTE — Telephone Encounter (Signed)
 Can we see if the fioricet has helped and if MRI/neurology is scheduled

## 2024-07-18 NOTE — Telephone Encounter (Signed)
 Spoke with patient.  She stated that the medication is helping and she takes it at night.  MRI/neuro are both scheduled.

## 2024-07-18 NOTE — Telephone Encounter (Signed)
-----   Message from [facility] sent at 07/08/2024  4:20 PM EDT ----- Regarding: headahces Can we see if the fioricet has helped and if MRI/neurology is scheduled

## 2024-07-22 ENCOUNTER — Ambulatory Visit
Admission: RE | Admit: 2024-07-22 | Discharge: 2024-07-22 | Disposition: A | Discharge status: Home / Self Care | Source: Ambulatory Visit | Attending: Nurse Practitioner | Admitting: Nurse Practitioner

## 2024-07-22 DIAGNOSIS — R519 Headache, unspecified: Secondary | ICD-10-CM | POA: Diagnosis not present

## 2024-07-22 DIAGNOSIS — R42 Dizziness and giddiness: Secondary | ICD-10-CM | POA: Diagnosis not present

## 2024-07-24 ENCOUNTER — Telehealth: Payer: Self-pay

## 2024-07-24 ENCOUNTER — Encounter: Payer: Self-pay | Admitting: Nurse Practitioner

## 2024-07-24 DIAGNOSIS — K118 Other diseases of salivary glands: Secondary | ICD-10-CM

## 2024-07-24 NOTE — Telephone Encounter (Signed)
 Received call from Tiffany at [facility] radiology ensuring we received results from MRI Brain patient had done this morning. See the impression below:   IMPRESSION: 1.  No evidence of an acute intracranial abnormality. 2. 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 3. Partially empty sella turcica. This finding can reflect incidental anatomic variation, or alternatively, it can be associated with chronic idiopathic intracranial hypertension (pseudotumor cerebri). 4. Otherwise unremarkable non-contrast MRI appearance of the brain. 5. 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 Consider ENT referral.

## 2024-07-24 NOTE — Telephone Encounter (Signed)
 Noted. Will release the results via mychart

## 2024-07-24 NOTE — Addendum Note (Signed)
 Addended by: WENDEE LYNWOOD HERO on: 07/24/2024 04:18 PM   Modules accepted: Orders

## 2024-07-26 ENCOUNTER — Ambulatory Visit (INDEPENDENT_AMBULATORY_CARE_PROVIDER_SITE_OTHER): Admitting: Podiatry

## 2024-07-26 ENCOUNTER — Encounter: Payer: Self-pay | Admitting: Podiatry

## 2024-07-26 DIAGNOSIS — M722 Plantar fascial fibromatosis: Secondary | ICD-10-CM | POA: Diagnosis not present

## 2024-07-26 MED ORDER — TRIAMCINOLONE ACETONIDE 10 MG/ML IJ SUSP
5.0000 mg | Freq: Once | INTRAMUSCULAR | Status: AC
  Administered 2024-07-26: 5 mg via INTRAMUSCULAR

## 2024-07-26 NOTE — Patient Instructions (Signed)

## 2024-07-26 NOTE — Progress Notes (Signed)
  Subjective:  Patient ID: Wendy Simpson, female    DOB: [birth year],  MRN: [id number]  Chief Complaint  Patient presents with   Foot Pain    Right foot heel pain. Medrol  dose pak. 95% better. Wants cortisone injection to improve 5% pain. 2 pain at the present. Non diabetic.     History of Present Illness [name] is a 48 year old female who presents with right heel pain.  She is doing better but there is still 1 area that is sore and she would have to proceed with a steroid injection today.  She does not report any injuries or changes or any other concerns.      Objective:    Physical Exam General: AAO x3, NAD  Dermatological: Skin is warm, dry and supple bilateral.  There are no open sores, no preulcerative lesions, no rash or signs of infection present.  Vascular: Dorsalis Pedis artery and Posterior Tibial artery pedal pulses are 2/4 bilateral with immedate capillary fill time.  There is no pain with calf compression, swelling, warmth, erythema.   Neruologic: Grossly intact via light touch bilateral.  Negative any send.  Musculoskeletal: There is still mild tenderness to palpation along the plantar medial tubercle of the calcaneus at the insertion of plantar fascia on the right foot. There is no pain along the course of the plantar fascia within the arch of the foot.  Overall symptoms have improved but still present.  Plantar fascia appears to be intact. There is no pain with lateral compression of the calcaneus or pain with vibratory sensation. There is no pain along the course or insertion of the achilles tendon. No other areas of tenderness to bilateral lower extremities.   Gait: Unassisted, Nonantalgic.     No images are attached to the encounter.    Assessment:   1. Plantar fasciitis of right foot      Plan:  Patient was evaluated and treated and all questions answered.  Assessment and Plan Assessment & Plan Right plantar fasciitis with associated heel  spur - Injection performed.  See procedure note below. - Encouraged to continue stretching, icing regular basis. -Continue plantar fascial brace for support. - Continue night splint for dorsiflexion during rest. - Advised on supportive footwear.  Procedure: Injection Tendon/Ligament Discussed alternatives, risks, complications and verbal consent was obtained.  Location: Right plantar fascia at the glabrous junction; medial approach. Skin Prep: Alcohol . Injectate: 0.5cc 0.5% marcaine  plain, 0.5 cc 2% lidocaine  plain and, 1 cc kenalog 10. Disposition: Patient tolerated procedure well. Injection site dressed with a band-aid.  Post-injection care was discussed and return precautions discussed.   No follow-ups on file.  Donnice JONELLE Fees DPM

## 2024-07-29 DIAGNOSIS — Z23 Encounter for immunization: Secondary | ICD-10-CM | POA: Diagnosis not present

## 2024-08-05 ENCOUNTER — Ambulatory Visit: Admitting: Podiatry

## 2024-08-05 ENCOUNTER — Ambulatory Visit (INDEPENDENT_AMBULATORY_CARE_PROVIDER_SITE_OTHER): Admitting: Otolaryngology

## 2024-08-05 ENCOUNTER — Encounter (INDEPENDENT_AMBULATORY_CARE_PROVIDER_SITE_OTHER): Payer: Self-pay | Admitting: Otolaryngology

## 2024-08-05 ENCOUNTER — Encounter (HOSPITAL_COMMUNITY): Payer: Self-pay

## 2024-08-05 VITALS — BP 131/82 | HR 99 | Ht 63.0 in | Wt 195.0 lb

## 2024-08-05 DIAGNOSIS — K118 Other diseases of salivary glands: Secondary | ICD-10-CM

## 2024-08-05 NOTE — Progress Notes (Signed)
 Karalee Wilkie POUR, MD  Karen Huhta Approved for US  guided FNA of tiny 7 mm LEFT PAROTID cyst/nodule.  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

## 2024-08-05 NOTE — Progress Notes (Signed)
 ENT CONSULT:  Reason for Consult: left parotid mass   HPI: Discussed the use of AI scribe software for clinical note transcription with the patient, who gave verbal consent to proceed.  History of Present Illness Wendy Simpson is a 48 year old female who presents with a small lesion in the left parotid gland found on MRI.  An MRI of the brain incidentally revealed a small lesion in the left parotid gland measuring 7 millimeters. She has not noticed any lump herself and has no history of skin cancer. No facial weakness, numbness, or tingling. She also has no history of smoking.  She has a strong dislike for lidocaine  due to past experiences but is open to procedures. She previously underwent double mastectomies due to multiple lesions and lumps found on mammograms and MRIs, although she did not have cancer. She opted for removal of all lesions at that time to avoid future complications.  She works for the [employer] and is concerned about the downtime post-procedure, hoping to continue her work activities without significant interruption.   Records Reviewed:  Lynwood Crandall NP note 07/08/24 Frequent headaches     History of same with increasing frequency and severity.  Patient is also experiencing dizziness.  Does have a history of a stroke neurologically intact.  Patient has tried Flexeril  without great relief.  We will obtain MRI brain and refer to neurology.  We will trial Fioricet in the interim        Relevant Medications    butalbital -acetaminophen -caffeine  (FIORICET) 50-325-40 MG tablet    Other Relevant Orders    Ambulatory referral to Neurology    MR Brain Wo Contrast    History of TIA (transient ischemic attack)    Stable neurologically intact        Past Medical History:  Diagnosis Date   Anxiety    Asthma    exercise induced   Depression    Hypertension    PONV (postoperative nausea and vomiting)    Stroke (HCC) 10/24/2001   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     Past Surgical History:  Procedure Laterality Date   BREAST BIOPSY     coplex sclerosiing leasion . LCIS   BREAST RECONSTRUCTION WITH PLACEMENT OF TISSUE EXPANDER AND FLEX HD (ACELLULAR HYDRATED DERMIS) Bilateral 06/15/2021   Procedure: BILATERAL BREAST RECONSTRUCTION WITH PLACEMENT OF TISSUE EXPANDERS AND FLEX HD (ACELLULAR HYDRATED DERMIS);  Surgeon: Elisabeth Craig RAMAN, MD;  Location: [facility];  Service: Plastics;  Laterality: Bilateral;   CESAREAN SECTION  10/24/1993   GALLBLADDER SURGERY  07/24/2010   KIDNEY STONE SURGERY  07/25/2011   MASTECTOMY Bilateral    NIPPLE SPARING MASTECTOMY Bilateral 06/15/2021   Procedure: BILATERAL NIPPLE SPARING MASTECTOMY;  Surgeon: Vernetta Berg, MD;  Location: [facility];  Service: General;  Laterality: Bilateral;   REMOVAL OF BILATERAL TISSUE EXPANDERS WITH PLACEMENT OF BILATERAL BREAST IMPLANTS Bilateral 09/14/2021   Procedure: REMOVAL OF BILATERAL TISSUE EXPANDERS WITH PLACEMENT OF BILATERAL BREAST IMPLANTS;  Surgeon: Elisabeth Craig RAMAN, MD;  Location: [facility];  Service: Plastics;  Laterality: Bilateral;  1.5 hour   TUBAL LIGATION  10/24/2000    Family History  Problem Relation Age of Onset   Cancer Mother 15       L BREAST   Hypertension Mother 44   Raynaud syndrome Mother        NOT SURE AGE   Breast cancer Mother 28   Stroke Father    Hypertension Father 2  Kidney disease Maternal Grandfather     Social History:  reports that she has never smoked. She has never been exposed to tobacco smoke. She has never used smokeless tobacco. She reports current alcohol  use. She reports that she does not use drugs.  Allergies:  Allergies  Allergen Reactions   Codeine     REACTION: Rash    Medications: I have reviewed the patient's current medications.  The PMH, PSH, Medications, Allergies, and SH were reviewed and updated.  ROS: Constitutional: Negative for fever, weight loss and weight  gain. Cardiovascular: Negative for chest pain and dyspnea on exertion. Respiratory: Is not experiencing shortness of breath at rest. Gastrointestinal: Negative for nausea and vomiting. Neurological: Negative for headaches. Psychiatric: The patient is not nervous/anxious  Blood pressure 131/82, pulse 99, height 5' 3 (1.6 m), weight 195 lb (88.5 kg), SpO2 94%. Body mass index is 34.54 kg/m.  PHYSICAL EXAM:  Exam: General: Well-developed, well-nourished Respiratory Respiratory effort: Equal inspiration and expiration without stridor Cardiovascular Peripheral Vascular: Warm extremities with equal color/perfusion Eyes: No nystagmus with equal extraocular motion bilaterally Neuro/Psych/Balance: Patient oriented to person, place, and time; Appropriate mood and affect; Gait is intact with no imbalance; Cranial nerves I-XII are intact Head and Face Inspection: Normocephalic and atraumatic without mass or lesion Palpation: Facial skeleton intact without bony stepoffs Salivary Glands: No mass or tenderness Facial Strength: Facial motility symmetric and full bilaterally ENT Pinna: External ear intact and fully developed External canal: Canal is patent with intact skin Tympanic Membrane: Clear and mobile External Nose: No scar or anatomic deformity Internal Nose: Septum is straight. No polyp, or purulence. Mucosal edema and erythema present.  Bilateral inferior turbinate hypertrophy.  Lips, Teeth, and gums: Mucosa and teeth intact and viable TMJ: No pain to palpation with full mobility Oral cavity/oropharynx: No erythema or exudate, no lesions present Neck Neck and Trachea: Midline trachea without mass or lesion Thyroid : No mass or nodularity Lymphatics: No lymphadenopathy  Studies Reviewed: MR brain 07/22/24   IMPRESSION: 1.  No evidence of an acute intracranial abnormality. 2. 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 3. Partially empty sella turcica. This finding  can reflect incidental anatomic variation, or alternatively, it can be associated with chronic idiopathic intracranial hypertension (pseudotumor cerebri). 4. Otherwise unremarkable non-contrast MRI appearance of the brain. 5. 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 Consider ENT referral.   Assessment/Plan: Encounter Diagnosis  Name Primary?   Mass of left parotid gland Yes    Assessment and Plan Assessment & Plan Left parotid gland lesion 7 mm lesion in left parotid gland, likely benign. Superficial, possibly cystic, reducing facial nerve dissection risk. Removal recommended to prevent growth. We will order biopsy first to evaluate for risk of malignancy. No hx of skin cancer. CN 7 intact on exam - Order biopsy of left parotid gland mass - Provided radiology contact for biopsy scheduling. - If benign, consider watchful waiting with repeat MRI in one year vs surgery upfront to minimize risk of growth and more difficult resection    Thank you for allowing me to participate in the care of this patient. Please do not hesitate to contact me with any questions or concerns.   Elena Larry, MD Otolaryngology [facility] Phone: [phone] Fax: [phone]    08/05/2024, 8:35 AM

## 2024-08-06 ENCOUNTER — Encounter: Payer: Self-pay | Admitting: Obstetrics and Gynecology

## 2024-08-06 ENCOUNTER — Encounter (INDEPENDENT_AMBULATORY_CARE_PROVIDER_SITE_OTHER): Payer: Self-pay | Admitting: Otolaryngology

## 2024-08-07 ENCOUNTER — Telehealth (INDEPENDENT_AMBULATORY_CARE_PROVIDER_SITE_OTHER): Payer: Self-pay

## 2024-08-07 NOTE — Telephone Encounter (Signed)
 Spoke to patient regarding biopsy and surgery. Explained to patient the reasoning for biopsy prior to surgery. Patient wants to go ahead and do biopsy as scheduled prior to surgery. (Just letting Dr. Okey know)

## 2024-08-09 ENCOUNTER — Ambulatory Visit: Admitting: Podiatry

## 2024-08-12 ENCOUNTER — Encounter: Payer: Self-pay | Admitting: Neurology

## 2024-08-12 ENCOUNTER — Ambulatory Visit (INDEPENDENT_AMBULATORY_CARE_PROVIDER_SITE_OTHER): Admitting: Neurology

## 2024-08-12 VITALS — BP 123/72 | HR 86 | Ht 63.0 in | Wt 195.0 lb

## 2024-08-12 DIAGNOSIS — M542 Cervicalgia: Secondary | ICD-10-CM | POA: Diagnosis not present

## 2024-08-12 DIAGNOSIS — G43009 Migraine without aura, not intractable, without status migrainosus: Secondary | ICD-10-CM

## 2024-08-12 MED ORDER — TOPIRAMATE 25 MG PO TABS: 25.0000 mg | ORAL_TABLET | Freq: Every day | ORAL | 5 refills | Status: AC

## 2024-08-12 NOTE — Progress Notes (Signed)
 Medication Samples have been provided to the patient.  Drug name: Nurtec       Strength:    75 mg     Qty: 2  LOT: [id number]  Exp.Date: 11/28  Dosing instructions: as needed  The patient has been instructed regarding the correct time, dose, and frequency of taking this medication, including desired effects and most common side effects.   Jesusa JINNY Sharper 1:51 PM 08/12/2024

## 2024-08-12 NOTE — Progress Notes (Signed)
 NEUROLOGY CONSULTATION NOTE  Wendy Simpson MRN: [id number] DOB: 1976/04/20  Referring provider: Lynwood Crandall, NP Primary care provider: Lynwood Crandall, NP  Reason for consult:  headache  Assessment/Plan:   Migraine without aura, without status migrainosus, not intractable New daily persistent headache, suspect cervicogenic component.  Not associated with medication-overuse.   Refer to physical therapy for neck pain Start topiramate 25mg  at bedtime. We can increase to 50mg  at bedtime in 4 weeks if needed. Take Nurtec once daily as needed.  Cannot take triptans due to history of stroke.  Do not take Fioricet. Limit use of pain relievers to no more than 9 days out of the month to prevent risk of rebound or medication-overuse headache. Keep headache diary Follow up in       Subjective:  Wendy Simpson is a 48 year old right-handed female with HTN, exercise-induced asthma, depression, anxiety and history of stroke who presents for headaches.  History supplemented by referring provider's notes.  MRI of brain personally reviewed.  Onset:  Onset of constant headache beginning in 2024 Location:  varies:  but severe ones are diffuse Quality:  mild - ache/non-throbbing, severe - pounding Intensity:  mild- 3/10, severe - 6-7/10.  Aura:  absent Prodrome:  absent Postdrome:  absent Associated symptoms:  Photophobia,  She denies associated nausea, vomiting, visual disturbance, phonophobia, unilateral numbness or weakness. Duration:  mild persistent.  Severe - all day Frequency:  has mild persistent headache.  Severe flare ups occur 4 times a month. Frequency of abortive medication: 2 days a week Triggers:  worsening neck pain Relieving factors:  ibuprofen-acetaminophen  helps ease it a little but not significant. Activity:  aggravates  She also has persistent photosensitivity, neck and bilateral shoulder pain.  No visual disturbance - eye exam/dilated exam fine.  Denies visual  obscurations or pulsatile tinnitus.  She also has vertigo with spinning.  Initially occurred for few seconds when lying down or turning over in bed.  Now occurs with activity.   07/22/2024 MRI BRAIN WO:  1. No evidence of an acute intracranial abnormality. 2. 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 3. Partially empty sella turcica. This finding can reflect incidental anatomic variation, or alternatively, it can be associated with chronic idiopathic intracranial hypertension (pseudotumor cerebri). 4. Otherwise unremarkable non-contrast MRI appearance of the brain. 5. 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 Consider ENT referral.   Past NSAIDS/analgesics:  none Past abortive triptans:  none Past abortive ergotamine:  none Past muscle relaxants:  none Past anti-emetic:  none Past antihypertensive medications:  none Past antidepressant medications:  fluoxetine  Past anticonvulsant medications:  Depakote  Past anti-CGRP:  none Past vitamins/Herbal/Supplements:  none Past antihistamines/decongestants:  scopolamine  Other past therapies:  none  Current NSAIDS/analgesics:  ibuprofen-acetaminophen , Fioricet (not tried) Current triptans:  none Current ergotamine:  none Current anti-emetic:  Zofran -ODT 4mg  Current muscle relaxants:  Flexeril  5mg  TID PRN Current Antihypertensive medications:  losartan -HCTZ Current Antidepressant medications:  venlafaxine  XR 37.5mg  daily Current Anticonvulsant medications:  none Current anti-CGRP:  none Current Vitamins/Herbal/Supplements:  none Current Antihistamines/Decongestants:  none Other therapy:  none    Caffeine :  1 can diet Dr. Nunzio every morning.  No coffee Alcohol :  occasional Smoker:  no Diet:  16-32 oz water daily.  Peanut butter, crackers Exercise:  no Depression:  no; Anxiety:  yes Sleep hygiene:  poor.  5 hours a night She does get annual eye exams.    Prior history of  headache:  remote history of occasional tension type headache. History of TBI/concussion:  no Family history of headache:  no Family history of cerebral aneurysm:  no  Other history:  Had a TIA at age 23  years old.  Suddenly had a facial droop and lethargy.  Diagnosed with a blood clot at base of her skull.  She was on Coumadin for several weeks.  Not currently on antiplatelet therapy.       PAST MEDICAL HISTORY: Past Medical History:  Diagnosis Date   Anxiety    Asthma    exercise induced   Depression    Hypertension    PONV (postoperative nausea and vomiting)    Stroke (HCC) 10/24/2001   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    PAST SURGICAL HISTORY: Past Surgical History:  Procedure Laterality Date   BREAST BIOPSY     coplex sclerosiing leasion . LCIS   BREAST RECONSTRUCTION WITH PLACEMENT OF TISSUE EXPANDER AND FLEX HD (ACELLULAR HYDRATED DERMIS) Bilateral 06/15/2021   Procedure: BILATERAL BREAST RECONSTRUCTION WITH PLACEMENT OF TISSUE EXPANDERS AND FLEX HD (ACELLULAR HYDRATED DERMIS);  Surgeon: Elisabeth Craig RAMAN, MD;  Location: [facility];  Service: Plastics;  Laterality: Bilateral;   CESAREAN SECTION  10/24/1993   GALLBLADDER SURGERY  07/24/2010   KIDNEY STONE SURGERY  07/25/2011   MASTECTOMY Bilateral    NIPPLE SPARING MASTECTOMY Bilateral 06/15/2021   Procedure: BILATERAL NIPPLE SPARING MASTECTOMY;  Surgeon: Vernetta Berg, MD;  Location: [facility];  Service: General;  Laterality: Bilateral;   REMOVAL OF BILATERAL TISSUE EXPANDERS WITH PLACEMENT OF BILATERAL BREAST IMPLANTS Bilateral 09/14/2021   Procedure: REMOVAL OF BILATERAL TISSUE EXPANDERS WITH PLACEMENT OF BILATERAL BREAST IMPLANTS;  Surgeon: Elisabeth Craig RAMAN, MD;  Location: [facility];  Service: Plastics;  Laterality: Bilateral;  1.5 hour   TUBAL LIGATION  10/24/2000    MEDICATIONS: Current Outpatient Medications on File Prior to Visit  Medication Sig Dispense Refill    butalbital -acetaminophen -caffeine  (FIORICET) 50-325-40 MG tablet Take 1 tablet by mouth every 6 (six) hours as needed for headache. 14 tablet 0   cyclobenzaprine  (FLEXERIL ) 5 MG tablet Take 1 tablet (5 mg total) by mouth 3 (three) times daily as needed for muscle spasms. 30 tablet 1   hydrOXYzine  (ATARAX ) 10 MG tablet TAKE 1 TABLET BY MOUTH AT BEDTIME AS NEEDED. 30 tablet 1   losartan -hydrochlorothiazide (HYZAAR) 50-12.5 MG tablet TAKE 1 TABLET BY MOUTH EVERY DAY 90 tablet 3   omeprazole  (PRILOSEC  OTC) 20 MG tablet Take 1 tablet (20 mg total) by mouth daily. 28 tablet 1   ondansetron  (ZOFRAN -ODT) 4 MG disintegrating tablet Take 1 tablet (4 mg total) by mouth every 8 (eight) hours as needed for nausea or vomiting. 20 tablet 0   rosuvastatin  (CRESTOR ) 10 MG tablet Take 1 tablet (10 mg total) by mouth daily. 90 tablet 0   venlafaxine  XR (EFFEXOR  XR) 37.5 MG 24 hr capsule Take 1 capsule (37.5 mg total) by mouth daily with breakfast. 30 capsule 1   No current facility-administered medications on file prior to visit.    ALLERGIES: Allergies  Allergen Reactions   Codeine     REACTION: Rash    FAMILY HISTORY: Family History  Problem Relation Age of Onset   Cancer Mother 26       L BREAST   Hypertension Mother 10   Raynaud syndrome Mother        NOT SURE AGE   Breast cancer Mother 47   Stroke Father  Hypertension Father 14   Kidney disease Maternal Grandfather     Objective:  Blood pressure 123/72, pulse 86, height 5' 3 (1.6 m), weight 195 lb (88.5 kg), SpO2 97%. General: No acute distress.  Patient appears well-groomed.   Head:  Normocephalic/atraumatic Eyes:  fundi examined but not visualized Neck: supple, no paraspinal tenderness, full range of motion Heart: regular rate and rhythm Neurological Exam: Mental status: alert and oriented to person, place, and time, speech fluent and not dysarthric, language intact. Cranial nerves: CN I: not tested CN II: pupils equal, round and  reactive to light, visual fields intact CN III, IV, VI:  full range of motion, no nystagmus, no ptosis CN V: facial sensation intact. CN VII: upper and lower face symmetric CN VIII: hearing intact CN IX, X: gag intact, uvula midline CN XI: sternocleidomastoid and trapezius muscles intact CN XII: tongue midline Bulk & Tone: normal, no fasciculations. Motor:  muscle strength 5/5 throughout Sensation:  Pinprick and vibratory sensation intact. Deep Tendon Reflexes:  2+ throughout,  toes downgoing.   Finger to nose testing:  Without dysmetria.   Gait:  Normal station and stride.  Romberg negative.    Thank you for allowing me to take part in the care of this patient.  Juliene Dunnings, DO  CC: Lynwood Crandall, NP

## 2024-08-12 NOTE — Patient Instructions (Addendum)
  Refer to physical therapy for neck pain Start topiramate 25mg  at bedtime.  Contact us  in 4 weeks with update and we can increase dose if needed. Take NURTEC at earliest onset of headache.  Maximum 1 tablet in 24 hours.  Let me know if you want a prescription Limit use of pain relievers to no more than 9 days out of the month.  These medications include acetaminophen , NSAIDs (ibuprofen/Advil/Motrin, naproxen/Aleve, triptans (Imitrex/sumatriptan), Excedrin, and narcotics.  This will help reduce risk of rebound headaches.  DO NOT TAKE FIORICET Be aware of common food triggers:  - Caffeine :  coffee, black tea, cola, Mt. Dew  - Chocolate  - Dairy:  aged cheeses (brie, blue, cheddar, gouda, Parmasan, provolone, romano, Swiss, etc), chocolate milk, buttermilk, sour cream, limit eggs and yogurt  - Nuts, peanut butter  - Alcohol   - Cereals/grains:  FRESH breads (fresh bagels, sourdough, doughnuts), yeast productions  - Processed/canned/aged/cured meats (pre-packaged deli meats, hotdogs)  - MSG/glutamate:  soy sauce, flavor enhancer, pickled/preserved/marinated foods  - Sweeteners:  aspartame (Equal, Nutrasweet).  Sugar and Splenda are okay  - Vegetables:  legumes (lima beans, lentils, snow peas, fava beans, pinto peans, peas, garbanzo beans), sauerkraut, onions, olives, pickles  - Fruit:  avocados, bananas, citrus fruit (orange, lemon, grapefruit), mango  - Other:  Frozen meals, macaroni and cheese Routine exercise Stay adequately hydrated (aim for 64 oz water daily) Keep headache diary Maintain proper stress management Maintain proper sleep hygiene Do not skip meals Consider supplements:  magnesium  citrate 400mg  daily, riboflavin 400mg  daily, coenzyme Q10 100mg  three times daily.

## 2024-08-19 ENCOUNTER — Encounter: Payer: Self-pay | Admitting: Obstetrics and Gynecology

## 2024-08-19 ENCOUNTER — Other Ambulatory Visit: Payer: Self-pay | Admitting: Obstetrics and Gynecology

## 2024-08-19 DIAGNOSIS — N83292 Other ovarian cyst, left side: Secondary | ICD-10-CM

## 2024-08-19 DIAGNOSIS — D259 Leiomyoma of uterus, unspecified: Secondary | ICD-10-CM | POA: Insufficient documentation

## 2024-08-19 DIAGNOSIS — N83202 Unspecified ovarian cyst, left side: Secondary | ICD-10-CM

## 2024-08-23 ENCOUNTER — Ambulatory Visit: Admitting: Family Medicine

## 2024-08-23 VITALS — BP 113/76 | HR 77 | Wt 200.0 lb

## 2024-08-23 DIAGNOSIS — F411 Generalized anxiety disorder: Secondary | ICD-10-CM

## 2024-08-23 DIAGNOSIS — N951 Menopausal and female climacteric states: Secondary | ICD-10-CM | POA: Diagnosis not present

## 2024-08-23 MED ORDER — VENLAFAXINE HCL ER 37.5 MG PO CP24: 37.5000 mg | ORAL_CAPSULE | Freq: Every day | ORAL | 12 refills | Status: AC

## 2024-08-23 NOTE — Progress Notes (Signed)
   GYNECOLOGY PROBLEM  VISIT ENCOUNTER NOTE  Subjective:   Wendy Simpson is a 48 y.o. G4P2002 female here for a problem GYN visit.  Current complaints: was having irritability and started on Effexor  in August. Reports greatly improved symptoms. Improved angry and irritability and does not feel like choking people all the time.   Denies abnormal vaginal bleeding, discharge, pelvic pain, problems with intercourse or other gynecologic concerns.    Gynecologic History Patient's last menstrual period was 08/22/2024.  Contraception: none  Health Maintenance Due  Topic Date Due   Pneumococcal Vaccine: 50+ Years (1 of 2 - PCV) Never done   Hepatitis B Vaccines 19-59 Average Risk (1 of 3 - 19+ 3-dose series) Never done   Zoster Vaccines- Shingrix (1 of 2) Never done   DTaP/Tdap/Td (3 - Td or Tdap) 11/24/2018   Colonoscopy  Never done   COVID-19 Vaccine (3 - Pfizer risk series) 03/14/2020   Mammogram  04/14/2022    The following portions of the patient's history were reviewed and updated as appropriate: allergies, current medications, past family history, past medical history, past social history, past surgical history and problem list.  Review of Systems Pertinent items are noted in HPI.   Objective:  BP 113/76   Pulse 77   Wt 200 lb (90.7 kg)   LMP 08/22/2024   BMI 35.43 kg/m   Gen: well appearing, NAD HEENT: no scleral icterus CV: RR Lung: Normal WOB Ext: warm well perfused    Assessment and Plan:  1. Perimenopausal (Primary) Improved anger and irritability with effexor   2. GAD (generalized anxiety disorder) Improved with Effexor .  Monitor sx and if worsening would recommend dose increase but will maintain current dose  Please refer to After Visit Summary for other counseling recommendations.   Return if symptoms worsen or fail to improve.  Suzen Maryan Masters, MD, MPH, ABFM Attending Physician Faculty Practice- [facility]

## 2024-08-23 NOTE — Progress Notes (Signed)
 Patient here for F/U on Effexor  treatment from 06/21/24 visit.   Pt states she is doing well and has no concerns today.

## 2024-08-26 ENCOUNTER — Ambulatory Visit

## 2024-09-03 ENCOUNTER — Ambulatory Visit (HOSPITAL_COMMUNITY)
Admission: RE | Admit: 2024-09-03 | Discharge: 2024-09-03 | Disposition: A | Discharge status: Home / Self Care | Source: Ambulatory Visit | Attending: Otolaryngology | Admitting: Otolaryngology

## 2024-09-03 ENCOUNTER — Ambulatory Visit

## 2024-09-03 DIAGNOSIS — K118 Other diseases of salivary glands: Secondary | ICD-10-CM | POA: Diagnosis not present

## 2024-09-03 MED ORDER — LIDOCAINE HCL (PF) 1 % IJ SOLN
10.0000 mL | Freq: Once | INTRAMUSCULAR | Status: AC
  Administered 2024-09-03: 10 mL via INTRADERMAL

## 2024-09-03 NOTE — Procedures (Signed)
 Interventional Radiology Procedure Note  Procedure: US  Guided Biopsy of left parotid mass  Complications: None  Estimated Blood Loss: < 10 mL  Findings: 25 G FNA biopsy  x 4 of 9 mm left parotid lesion performed under US  guidance.    Marcey DASEN. Luverne, M.D Pager:  [phone]

## 2024-09-04 LAB — CYTOLOGY - NON PAP

## 2024-10-07 ENCOUNTER — Other Ambulatory Visit: Payer: Self-pay | Admitting: Nurse Practitioner

## 2024-10-07 DIAGNOSIS — E7800 Pure hypercholesterolemia, unspecified: Secondary | ICD-10-CM

## 2024-10-14 ENCOUNTER — Other Ambulatory Visit

## 2024-10-15 ENCOUNTER — Institutional Professional Consult (permissible substitution) (INDEPENDENT_AMBULATORY_CARE_PROVIDER_SITE_OTHER)

## 2024-11-05 ENCOUNTER — Encounter: Payer: Self-pay | Admitting: Nurse Practitioner

## 2024-11-06 NOTE — Telephone Encounter (Signed)
 Bari,  See mychart message below and reach out to patient please

## 2024-11-07 NOTE — Telephone Encounter (Signed)
 Called and left message on vmail to call back to schedule

## 2024-11-27 ENCOUNTER — Encounter: Payer: Self-pay | Admitting: Family Medicine

## 2025-03-04 ENCOUNTER — Ambulatory Visit: Admitting: Neurology
